# Patient Record
Sex: Female | Born: 1945 | Race: White | Hispanic: No | Smoking: Never smoker
Health system: Southern US, Community
[De-identification: ages and names within clinical notes are randomized; demographics above are authoritative.]

## PROBLEM LIST (undated history)

## (undated) DIAGNOSIS — L409 Psoriasis, unspecified: Secondary | ICD-10-CM

## (undated) DIAGNOSIS — F329 Major depressive disorder, single episode, unspecified: Secondary | ICD-10-CM

## (undated) DIAGNOSIS — M797 Fibromyalgia: Secondary | ICD-10-CM

## (undated) DIAGNOSIS — G9332 Myalgic encephalomyelitis/chronic fatigue syndrome: Secondary | ICD-10-CM

## (undated) DIAGNOSIS — R5382 Chronic fatigue, unspecified: Secondary | ICD-10-CM

## (undated) DIAGNOSIS — M8580 Other specified disorders of bone density and structure, unspecified site: Secondary | ICD-10-CM

## (undated) DIAGNOSIS — F32A Depression, unspecified: Secondary | ICD-10-CM

## (undated) HISTORY — DX: Depression, unspecified: F32.A

## (undated) HISTORY — DX: Psoriasis, unspecified: L40.9

## (undated) HISTORY — DX: Other specified disorders of bone density and structure, unspecified site: M85.80

## (undated) HISTORY — DX: Major depressive disorder, single episode, unspecified: F32.9

## (undated) HISTORY — DX: Fibromyalgia: M79.7

## (undated) HISTORY — DX: Myalgic encephalomyelitis/chronic fatigue syndrome: G93.32

## (undated) HISTORY — DX: Chronic fatigue, unspecified: R53.82

---

## 2005-12-11 ENCOUNTER — Other Ambulatory Visit: Admission: RE | Admit: 2005-12-11 | Discharge: 2005-12-11 | Payer: Self-pay | Admitting: Family Medicine

## 2006-01-18 ENCOUNTER — Ambulatory Visit (HOSPITAL_COMMUNITY): Admission: RE | Admit: 2006-01-18 | Discharge: 2006-01-18 | Payer: Self-pay | Admitting: Family Medicine

## 2006-01-28 ENCOUNTER — Encounter: Admission: RE | Admit: 2006-01-28 | Discharge: 2006-01-28 | Payer: Self-pay | Admitting: Family Medicine

## 2007-01-08 ENCOUNTER — Other Ambulatory Visit: Admission: RE | Admit: 2007-01-08 | Discharge: 2007-01-08 | Payer: Self-pay | Admitting: Family Medicine

## 2007-01-23 ENCOUNTER — Encounter: Admission: RE | Admit: 2007-01-23 | Discharge: 2007-01-23 | Payer: Self-pay | Admitting: Family Medicine

## 2008-01-02 ENCOUNTER — Encounter: Admission: RE | Admit: 2008-01-02 | Discharge: 2008-01-02 | Payer: Self-pay | Admitting: Family Medicine

## 2008-01-26 ENCOUNTER — Encounter: Admission: RE | Admit: 2008-01-26 | Discharge: 2008-01-26 | Payer: Self-pay | Admitting: Family Medicine

## 2008-03-17 ENCOUNTER — Other Ambulatory Visit: Admission: RE | Admit: 2008-03-17 | Discharge: 2008-03-17 | Payer: Self-pay | Admitting: Family Medicine

## 2008-03-29 ENCOUNTER — Emergency Department (HOSPITAL_COMMUNITY): Admission: EM | Admit: 2008-03-29 | Discharge: 2008-03-29 | Payer: Self-pay | Admitting: Emergency Medicine

## 2008-04-02 ENCOUNTER — Encounter: Admission: RE | Admit: 2008-04-02 | Discharge: 2008-04-02 | Payer: Self-pay | Admitting: Family Medicine

## 2010-05-13 ENCOUNTER — Encounter: Payer: Self-pay | Admitting: Family Medicine

## 2011-01-26 LAB — DIFFERENTIAL
Eosinophils Relative: 3 % (ref 0–5)
Lymphocytes Relative: 35 % (ref 12–46)
Lymphs Abs: 1.3 10*3/uL (ref 0.7–4.0)
Monocytes Absolute: 0.3 10*3/uL (ref 0.1–1.0)

## 2011-01-26 LAB — CBC
HCT: 40.8 % (ref 36.0–46.0)
Hemoglobin: 13.8 g/dL (ref 12.0–15.0)
RDW: 13.6 % (ref 11.5–15.5)

## 2011-01-26 LAB — POCT I-STAT, CHEM 8
BUN: 14 mg/dL (ref 6–23)
Calcium, Ion: 1.18 mmol/L (ref 1.12–1.32)
Creatinine, Ser: 1.1 mg/dL (ref 0.4–1.2)
Glucose, Bld: 111 mg/dL — ABNORMAL HIGH (ref 70–99)
TCO2: 26 mmol/L (ref 0–100)

## 2011-06-22 DIAGNOSIS — R1013 Epigastric pain: Secondary | ICD-10-CM | POA: Diagnosis not present

## 2011-06-22 DIAGNOSIS — R5381 Other malaise: Secondary | ICD-10-CM | POA: Diagnosis not present

## 2011-06-22 DIAGNOSIS — J029 Acute pharyngitis, unspecified: Secondary | ICD-10-CM | POA: Diagnosis not present

## 2011-06-22 DIAGNOSIS — R109 Unspecified abdominal pain: Secondary | ICD-10-CM | POA: Diagnosis not present

## 2011-06-22 DIAGNOSIS — R5383 Other fatigue: Secondary | ICD-10-CM | POA: Diagnosis not present

## 2011-07-17 DIAGNOSIS — IMO0001 Reserved for inherently not codable concepts without codable children: Secondary | ICD-10-CM | POA: Diagnosis not present

## 2011-07-17 DIAGNOSIS — K644 Residual hemorrhoidal skin tags: Secondary | ICD-10-CM | POA: Diagnosis not present

## 2011-07-17 DIAGNOSIS — R1013 Epigastric pain: Secondary | ICD-10-CM | POA: Diagnosis not present

## 2011-07-17 DIAGNOSIS — Z0000 Encounter for general adult medical examination without abnormal findings: Secondary | ICD-10-CM | POA: Diagnosis not present

## 2011-07-17 DIAGNOSIS — L940 Localized scleroderma [morphea]: Secondary | ICD-10-CM | POA: Diagnosis not present

## 2011-07-17 DIAGNOSIS — G4700 Insomnia, unspecified: Secondary | ICD-10-CM | POA: Diagnosis not present

## 2011-08-07 DIAGNOSIS — F0390 Unspecified dementia without behavioral disturbance: Secondary | ICD-10-CM | POA: Diagnosis not present

## 2011-08-09 ENCOUNTER — Ambulatory Visit: Payer: Self-pay | Admitting: Licensed Clinical Social Worker

## 2011-08-15 DIAGNOSIS — F329 Major depressive disorder, single episode, unspecified: Secondary | ICD-10-CM | POA: Diagnosis not present

## 2011-08-28 DIAGNOSIS — F329 Major depressive disorder, single episode, unspecified: Secondary | ICD-10-CM | POA: Diagnosis not present

## 2011-09-10 DIAGNOSIS — F329 Major depressive disorder, single episode, unspecified: Secondary | ICD-10-CM | POA: Diagnosis not present

## 2011-09-18 DIAGNOSIS — F329 Major depressive disorder, single episode, unspecified: Secondary | ICD-10-CM | POA: Diagnosis not present

## 2011-09-24 DIAGNOSIS — F329 Major depressive disorder, single episode, unspecified: Secondary | ICD-10-CM | POA: Diagnosis not present

## 2011-10-16 DIAGNOSIS — F329 Major depressive disorder, single episode, unspecified: Secondary | ICD-10-CM | POA: Diagnosis not present

## 2011-10-23 DIAGNOSIS — G4700 Insomnia, unspecified: Secondary | ICD-10-CM | POA: Diagnosis not present

## 2011-10-23 DIAGNOSIS — K649 Unspecified hemorrhoids: Secondary | ICD-10-CM | POA: Diagnosis not present

## 2011-10-23 DIAGNOSIS — L940 Localized scleroderma [morphea]: Secondary | ICD-10-CM | POA: Diagnosis not present

## 2011-10-23 DIAGNOSIS — IMO0001 Reserved for inherently not codable concepts without codable children: Secondary | ICD-10-CM | POA: Diagnosis not present

## 2011-10-30 DIAGNOSIS — F329 Major depressive disorder, single episode, unspecified: Secondary | ICD-10-CM | POA: Diagnosis not present

## 2011-11-19 DIAGNOSIS — F329 Major depressive disorder, single episode, unspecified: Secondary | ICD-10-CM | POA: Diagnosis not present

## 2011-11-26 DIAGNOSIS — F329 Major depressive disorder, single episode, unspecified: Secondary | ICD-10-CM | POA: Diagnosis not present

## 2011-12-21 DIAGNOSIS — Z79899 Other long term (current) drug therapy: Secondary | ICD-10-CM | POA: Diagnosis not present

## 2011-12-21 DIAGNOSIS — R5383 Other fatigue: Secondary | ICD-10-CM | POA: Diagnosis not present

## 2011-12-21 DIAGNOSIS — IMO0001 Reserved for inherently not codable concepts without codable children: Secondary | ICD-10-CM | POA: Diagnosis not present

## 2011-12-21 DIAGNOSIS — R404 Transient alteration of awareness: Secondary | ICD-10-CM | POA: Diagnosis not present

## 2011-12-21 DIAGNOSIS — R5381 Other malaise: Secondary | ICD-10-CM | POA: Diagnosis not present

## 2011-12-21 DIAGNOSIS — R7309 Other abnormal glucose: Secondary | ICD-10-CM | POA: Diagnosis not present

## 2012-01-01 DIAGNOSIS — F341 Dysthymic disorder: Secondary | ICD-10-CM | POA: Diagnosis not present

## 2012-01-04 DIAGNOSIS — G4710 Hypersomnia, unspecified: Secondary | ICD-10-CM | POA: Diagnosis not present

## 2012-01-04 DIAGNOSIS — R7309 Other abnormal glucose: Secondary | ICD-10-CM | POA: Diagnosis not present

## 2012-01-04 DIAGNOSIS — B009 Herpesviral infection, unspecified: Secondary | ICD-10-CM | POA: Diagnosis not present

## 2012-01-04 DIAGNOSIS — R5383 Other fatigue: Secondary | ICD-10-CM | POA: Diagnosis not present

## 2012-01-04 DIAGNOSIS — R5381 Other malaise: Secondary | ICD-10-CM | POA: Diagnosis not present

## 2012-01-09 DIAGNOSIS — R5383 Other fatigue: Secondary | ICD-10-CM | POA: Diagnosis not present

## 2012-01-09 DIAGNOSIS — R7309 Other abnormal glucose: Secondary | ICD-10-CM | POA: Diagnosis not present

## 2012-01-09 DIAGNOSIS — R5381 Other malaise: Secondary | ICD-10-CM | POA: Diagnosis not present

## 2012-01-13 DIAGNOSIS — G4710 Hypersomnia, unspecified: Secondary | ICD-10-CM | POA: Diagnosis not present

## 2012-02-05 DIAGNOSIS — IMO0001 Reserved for inherently not codable concepts without codable children: Secondary | ICD-10-CM | POA: Diagnosis not present

## 2012-02-05 DIAGNOSIS — B009 Herpesviral infection, unspecified: Secondary | ICD-10-CM | POA: Diagnosis not present

## 2012-02-05 DIAGNOSIS — R5381 Other malaise: Secondary | ICD-10-CM | POA: Diagnosis not present

## 2012-02-05 DIAGNOSIS — G4700 Insomnia, unspecified: Secondary | ICD-10-CM | POA: Diagnosis not present

## 2012-02-05 DIAGNOSIS — F329 Major depressive disorder, single episode, unspecified: Secondary | ICD-10-CM | POA: Diagnosis not present

## 2012-02-20 DIAGNOSIS — M549 Dorsalgia, unspecified: Secondary | ICD-10-CM | POA: Diagnosis not present

## 2012-02-20 DIAGNOSIS — M62838 Other muscle spasm: Secondary | ICD-10-CM | POA: Diagnosis not present

## 2012-02-21 ENCOUNTER — Other Ambulatory Visit: Payer: Self-pay

## 2012-02-21 ENCOUNTER — Ambulatory Visit
Admission: RE | Admit: 2012-02-21 | Discharge: 2012-02-21 | Disposition: A | Discharge status: Home / Self Care | Payer: Medicare Other | Source: Ambulatory Visit

## 2012-02-21 DIAGNOSIS — M549 Dorsalgia, unspecified: Secondary | ICD-10-CM

## 2012-02-21 DIAGNOSIS — M47814 Spondylosis without myelopathy or radiculopathy, thoracic region: Secondary | ICD-10-CM | POA: Diagnosis not present

## 2012-02-21 DIAGNOSIS — M5137 Other intervertebral disc degeneration, lumbosacral region: Secondary | ICD-10-CM | POA: Diagnosis not present

## 2012-02-29 DIAGNOSIS — F329 Major depressive disorder, single episode, unspecified: Secondary | ICD-10-CM | POA: Diagnosis not present

## 2012-03-03 DIAGNOSIS — B009 Herpesviral infection, unspecified: Secondary | ICD-10-CM | POA: Diagnosis not present

## 2012-03-03 DIAGNOSIS — D239 Other benign neoplasm of skin, unspecified: Secondary | ICD-10-CM | POA: Diagnosis not present

## 2012-03-03 DIAGNOSIS — L940 Localized scleroderma [morphea]: Secondary | ICD-10-CM | POA: Diagnosis not present

## 2012-03-03 DIAGNOSIS — L408 Other psoriasis: Secondary | ICD-10-CM | POA: Diagnosis not present

## 2012-03-07 DIAGNOSIS — IMO0001 Reserved for inherently not codable concepts without codable children: Secondary | ICD-10-CM | POA: Diagnosis not present

## 2012-03-07 DIAGNOSIS — M546 Pain in thoracic spine: Secondary | ICD-10-CM | POA: Diagnosis not present

## 2012-03-07 DIAGNOSIS — M545 Low back pain: Secondary | ICD-10-CM | POA: Diagnosis not present

## 2012-03-09 ENCOUNTER — Emergency Department (HOSPITAL_COMMUNITY)
Admission: EM | Admit: 2012-03-09 | Discharge: 2012-03-10 | Disposition: A | Discharge status: Home / Self Care | Payer: Medicare Other | Attending: Emergency Medicine | Admitting: Emergency Medicine

## 2012-03-09 DIAGNOSIS — Z79899 Other long term (current) drug therapy: Secondary | ICD-10-CM | POA: Insufficient documentation

## 2012-03-09 DIAGNOSIS — M545 Low back pain, unspecified: Secondary | ICD-10-CM | POA: Insufficient documentation

## 2012-03-09 DIAGNOSIS — M79609 Pain in unspecified limb: Secondary | ICD-10-CM | POA: Diagnosis not present

## 2012-03-09 NOTE — ED Notes (Signed)
ems- lower back pain radiated to bil side but origin of pain is spine per patient ,  for 3 months, went to spine specialist seem, schedule MRI, has not had yet, pain increase ly got worse today, hurt bad 10/10. Took flexeril, tramadol, nothing is helping.Marland Kitchen Hx depression psoriais, fibromyalgia- vs- 128/ hr-88,, given topical cream use for skin, ativan daily,

## 2012-03-09 NOTE — ED Notes (Signed)
Bed:[facility]ALA<BR> Expected date:03/09/12<BR> Expected time:11:07 PM<BR> Means of arrival:Ambulance<BR> Comments:<BR> Ambulatory; back pain

## 2012-03-10 DIAGNOSIS — M549 Dorsalgia, unspecified: Secondary | ICD-10-CM | POA: Diagnosis not present

## 2012-03-10 DIAGNOSIS — G4700 Insomnia, unspecified: Secondary | ICD-10-CM | POA: Diagnosis not present

## 2012-03-10 DIAGNOSIS — G4710 Hypersomnia, unspecified: Secondary | ICD-10-CM | POA: Diagnosis not present

## 2012-03-10 MED ORDER — DIAZEPAM 2 MG PO TABS
2.0000 mg | ORAL_TABLET | Freq: Once | ORAL | Status: AC
  Administered 2012-03-10: 2 mg via ORAL
  Filled 2012-03-10: qty 1

## 2012-03-10 MED ORDER — DIAZEPAM 2 MG PO TABS: 2.0000 mg | ORAL_TABLET | Freq: Four times a day (QID) | ORAL | Status: DC | PRN

## 2012-03-10 MED ORDER — HYDROCODONE-ACETAMINOPHEN 7.5-500 MG/15ML PO SOLN
5.0000 mL | Freq: Once | ORAL | Status: AC
  Administered 2012-03-10: 5 mL via ORAL
  Filled 2012-03-10 (×2): qty 15

## 2012-03-10 MED ORDER — HYDROCODONE-ACETAMINOPHEN 7.5-500 MG/15ML PO SOLN: 5.0000 mL | ORAL | Status: DC | PRN

## 2012-03-10 MED ORDER — DIAZEPAM 2 MG PO TABS
ORAL_TABLET | ORAL | Status: AC
  Administered 2012-03-10: 2 mg
  Filled 2012-03-10: qty 1

## 2012-03-10 MED ORDER — KETOROLAC TROMETHAMINE 30 MG/ML IJ SOLN
30.0000 mg | Freq: Once | INTRAMUSCULAR | Status: AC
  Administered 2012-03-10: 30 mg via INTRAMUSCULAR
  Filled 2012-03-10: qty 1

## 2012-03-10 MED ORDER — DIAZEPAM 2 MG PO TABS: 2.0000 mg | ORAL_TABLET | Freq: Once | ORAL | Status: DC

## 2012-03-10 NOTE — Progress Notes (Signed)
Chaplain engaged this patient who was actually bedded in the hall.  She was having back spasms and was in pain.  She states a sensitivity to medications.  She had been given a minimal dose of Valium and she felt it was useless and was hesitant about the tylenol-hydrocodone liquid the nurse practitioner wanted to give her.  In talking with the nurse practitioner, patient felt the NP was very condescending.  The Chaplain helped her work through that feeling, which wasn't easy, because the NP seemed to him to be somewhat insensitive.  The conversation moved to a question about suicide, which evolved to a more real discussion about her frustration with her chronic pain, the loss of hope for any relief with indications of guilt over things she has said in frustrations and certain "mistakes" she has made as a mother.  The Chaplain suggested she have a real heart-to-heart with her primary care physician regarding effective medication interventions.  For many things the Chaplain said, the patient had a been-there-done-that type of response, but she felt the Chaplain did understand her and was supportive.  The patient is battling chronic back pain and really had hoped (again) that this trip to the ED might open some doors for relief.  She has congregational support, but not necessarily to the extent of understanding what some goes through who lives in constant pain, though her pastor apparently suffers with chronic pain issues.  She has three children.  If she should be in the hospital, a Chaplain visit may be helpful, but the issue is one of long-term frustration leading to a sense of hopelessness.  The patient is not having suicidal ideation and speaks of dying in the context of the frustration-pain combination.  She clearly sttes she would not l;eave the burden of her suicide on her children, and it was really voicing deep frustration and near total hopelessness and helplessness.  Rema Jasmine, Chaplain Pager:  [phone]

## 2012-03-10 NOTE — ED Notes (Signed)
Ambulate pt half way to the bathroom, pt sts to writer her muscle spasms started back and she is still in pain.  Pt was able to finish walking to the bathroom.

## 2012-03-10 NOTE — ED Provider Notes (Signed)
History     CSN: [id number]  Arrival date & time 03/09/12  2329   First MD Initiated Contact with Patient 03/09/12 2339      Chief Complaint  Patient presents with  . Back Pain    radiating bil sides    (Consider location/radiation/quality/duration/timing/severity/associated sxs/prior treatment) HPI Comments: Patient has had chronic low back pain for the past 3-4 months.  She has been seen by her primary care physician, who sent her to the spine.  Specialist, who is scheduling an MRI of her lower spine.  Sometime this week.  She has been taking Flexeril, and Ultram, without any relief.  She has a history of fibromyalgia as well.  She denies any injury to that caused her pain.  She states today.  The pain is so bad, that she's been in the bed.  All day coming to the emergency room.  This evening by ambulance Patient had thoracic and lumbar x-rays performed October 31 which were normal  Patient is a 67 y.o. female presenting with back pain. The history is provided by the patient.  Back Pain  This is a chronic problem. The problem occurs constantly. The pain is present in the lumbar spine. The quality of the pain is described as aching. The pain does not radiate. The pain is at a severity of 8/10. The pain is moderate. Pertinent negatives include no chest pain, no fever, no numbness, no abdominal pain, no dysuria and no weakness.    No past medical history on file.  No past surgical history on file.  No family history on file.  History  Substance Use Topics  . Smoking status: Not on file  . Smokeless tobacco: Not on file  . Alcohol Use: Not on file    OB History    No data available      Review of Systems  Constitutional: Negative for fever and chills.  Respiratory: Negative for shortness of breath.   Cardiovascular: Negative for chest pain.  Gastrointestinal: Negative for nausea, vomiting, abdominal pain, diarrhea and constipation.  Genitourinary: Negative for dysuria.    Musculoskeletal: Positive for back pain. Negative for myalgias.  Skin: Negative for rash and wound.  Neurological: Negative for dizziness, weakness and numbness.    Allergies  Nsaids  Home Medications   Current Outpatient Rx  Name  Route  Sig  Dispense  Refill  . ACETAMINOPHEN 500 MG PO TABS   Oral   Take 500 mg by mouth every 6 (six) hours as needed. For pain         . CYCLOBENZAPRINE HCL 5 MG PO TABS   Oral   Take 10 mg by mouth 2 (two) times daily as needed. For pain         . LORAZEPAM 1 MG PO TABS   Oral   Take 0.5-1 mg by mouth daily as needed. For anxiety         . TRAMADOL HCL 50 MG PO TABS   Oral   Take 50 mg by mouth every 6 (six) hours as needed. For pain         . DIAZEPAM 2 MG PO TABS   Oral   Take 1 tablet (2 mg total) by mouth every 6 (six) hours as needed (muscle spasm).   10 tablet   0   . HYDROCODONE-ACETAMINOPHEN 7.5-500 MG/15ML PO SOLN   Oral   Take 5 mLs by mouth every 4 (four) hours as needed for pain.   60 mL  0     BP 115/64  Pulse 62  Temp 97.9 F (36.6 C) (Oral)  Resp 20  SpO2 97%  Physical Exam  Constitutional: She is oriented to person, place, and time. She appears well-developed.  HENT:  Head: Normocephalic.  Eyes: Pupils are equal, round, and reactive to light.  Neck: Normal range of motion.  Cardiovascular: Normal rate.   Pulmonary/Chest: Effort normal.  Musculoskeletal: Normal range of motion. She exhibits no edema and no tenderness.  Neurological: She is alert and oriented to person, place, and time.  Skin: Skin is warm. No rash noted. No erythema.    ED Course  Procedures (including critical care time)  Labs Reviewed - No data to display No results found.   1. Low back pain radiating to both legs       MDM  Will treat with IM, Toradol, and by mouth Valium, small doses, as patient, states she's very sensitive to medications and has taken everything in         Arman Filter, NP 03/10/12  [phone]

## 2012-03-10 NOTE — ED Provider Notes (Signed)
Medical screening examination/treatment/procedure(s) were performed by non-physician practitioner and as supervising physician I was immediately available for consultation/collaboration.   Ayisha Pol H Aricela Bertagnolli, MD 03/10/12 0554 

## 2012-03-10 NOTE — ED Notes (Signed)
MD at bedside. 

## 2012-03-10 NOTE — ED Notes (Signed)
Patient ambulated per order. Patient recd, asst by one person to ambulate from stretcher to nursing station, ambulated by self to bathroom , then escort minimally by tech to stretcher, patient moves slowly per observation appears to have discomfort on ambulate  but was able to move safely with minimal difficulty.

## 2012-03-11 DIAGNOSIS — M5137 Other intervertebral disc degeneration, lumbosacral region: Secondary | ICD-10-CM | POA: Diagnosis not present

## 2012-03-11 DIAGNOSIS — M5126 Other intervertebral disc displacement, lumbar region: Secondary | ICD-10-CM | POA: Diagnosis not present

## 2012-03-12 DIAGNOSIS — IMO0001 Reserved for inherently not codable concepts without codable children: Secondary | ICD-10-CM | POA: Diagnosis not present

## 2012-03-12 DIAGNOSIS — R5381 Other malaise: Secondary | ICD-10-CM | POA: Diagnosis not present

## 2012-03-12 DIAGNOSIS — F329 Major depressive disorder, single episode, unspecified: Secondary | ICD-10-CM | POA: Diagnosis not present

## 2012-03-12 DIAGNOSIS — M5380 Other specified dorsopathies, site unspecified: Secondary | ICD-10-CM | POA: Diagnosis not present

## 2012-03-14 DIAGNOSIS — M546 Pain in thoracic spine: Secondary | ICD-10-CM | POA: Diagnosis not present

## 2012-03-14 DIAGNOSIS — M545 Low back pain: Secondary | ICD-10-CM | POA: Diagnosis not present

## 2012-03-14 DIAGNOSIS — IMO0001 Reserved for inherently not codable concepts without codable children: Secondary | ICD-10-CM | POA: Diagnosis not present

## 2012-03-17 DIAGNOSIS — F331 Major depressive disorder, recurrent, moderate: Secondary | ICD-10-CM | POA: Diagnosis not present

## 2012-03-18 DIAGNOSIS — F331 Major depressive disorder, recurrent, moderate: Secondary | ICD-10-CM | POA: Diagnosis not present

## 2012-03-19 DIAGNOSIS — F39 Unspecified mood [affective] disorder: Secondary | ICD-10-CM | POA: Diagnosis not present

## 2012-03-19 DIAGNOSIS — F4542 Pain disorder with related psychological factors: Secondary | ICD-10-CM | POA: Diagnosis not present

## 2012-03-25 DIAGNOSIS — IMO0001 Reserved for inherently not codable concepts without codable children: Secondary | ICD-10-CM | POA: Diagnosis not present

## 2012-03-25 DIAGNOSIS — R7309 Other abnormal glucose: Secondary | ICD-10-CM | POA: Diagnosis not present

## 2012-03-25 DIAGNOSIS — R030 Elevated blood-pressure reading, without diagnosis of hypertension: Secondary | ICD-10-CM | POA: Diagnosis not present

## 2012-03-25 DIAGNOSIS — M5380 Other specified dorsopathies, site unspecified: Secondary | ICD-10-CM | POA: Diagnosis not present

## 2012-03-26 DIAGNOSIS — F331 Major depressive disorder, recurrent, moderate: Secondary | ICD-10-CM | POA: Diagnosis not present

## 2012-04-04 DIAGNOSIS — F331 Major depressive disorder, recurrent, moderate: Secondary | ICD-10-CM | POA: Diagnosis not present

## 2012-04-11 DIAGNOSIS — F331 Major depressive disorder, recurrent, moderate: Secondary | ICD-10-CM | POA: Diagnosis not present

## 2012-04-18 DIAGNOSIS — F331 Major depressive disorder, recurrent, moderate: Secondary | ICD-10-CM | POA: Diagnosis not present

## 2012-04-21 DIAGNOSIS — F331 Major depressive disorder, recurrent, moderate: Secondary | ICD-10-CM | POA: Diagnosis not present

## 2012-04-24 DIAGNOSIS — F329 Major depressive disorder, single episode, unspecified: Secondary | ICD-10-CM | POA: Diagnosis not present

## 2012-04-24 DIAGNOSIS — F4542 Pain disorder with related psychological factors: Secondary | ICD-10-CM | POA: Diagnosis not present

## 2012-04-25 DIAGNOSIS — F331 Major depressive disorder, recurrent, moderate: Secondary | ICD-10-CM | POA: Diagnosis not present

## 2012-04-30 DIAGNOSIS — R7309 Other abnormal glucose: Secondary | ICD-10-CM | POA: Diagnosis not present

## 2012-04-30 DIAGNOSIS — M5380 Other specified dorsopathies, site unspecified: Secondary | ICD-10-CM | POA: Diagnosis not present

## 2012-04-30 DIAGNOSIS — IMO0001 Reserved for inherently not codable concepts without codable children: Secondary | ICD-10-CM | POA: Diagnosis not present

## 2012-05-02 DIAGNOSIS — F329 Major depressive disorder, single episode, unspecified: Secondary | ICD-10-CM | POA: Diagnosis not present

## 2012-05-02 DIAGNOSIS — F4542 Pain disorder with related psychological factors: Secondary | ICD-10-CM | POA: Diagnosis not present

## 2012-05-05 DIAGNOSIS — F331 Major depressive disorder, recurrent, moderate: Secondary | ICD-10-CM | POA: Diagnosis not present

## 2012-05-07 DIAGNOSIS — F331 Major depressive disorder, recurrent, moderate: Secondary | ICD-10-CM | POA: Diagnosis not present

## 2012-05-13 DIAGNOSIS — F331 Major depressive disorder, recurrent, moderate: Secondary | ICD-10-CM | POA: Diagnosis not present

## 2012-05-23 DIAGNOSIS — F331 Major depressive disorder, recurrent, moderate: Secondary | ICD-10-CM | POA: Diagnosis not present

## 2012-06-02 DIAGNOSIS — F331 Major depressive disorder, recurrent, moderate: Secondary | ICD-10-CM | POA: Diagnosis not present

## 2012-06-17 DIAGNOSIS — F331 Major depressive disorder, recurrent, moderate: Secondary | ICD-10-CM | POA: Diagnosis not present

## 2012-07-12 DIAGNOSIS — F339 Major depressive disorder, recurrent, unspecified: Secondary | ICD-10-CM | POA: Diagnosis not present

## 2012-07-15 DIAGNOSIS — F339 Major depressive disorder, recurrent, unspecified: Secondary | ICD-10-CM | POA: Diagnosis not present

## 2012-07-18 DIAGNOSIS — F339 Major depressive disorder, recurrent, unspecified: Secondary | ICD-10-CM | POA: Diagnosis not present

## 2012-07-22 DIAGNOSIS — F339 Major depressive disorder, recurrent, unspecified: Secondary | ICD-10-CM | POA: Diagnosis not present

## 2012-07-25 DIAGNOSIS — F339 Major depressive disorder, recurrent, unspecified: Secondary | ICD-10-CM | POA: Diagnosis not present

## 2012-07-29 DIAGNOSIS — F339 Major depressive disorder, recurrent, unspecified: Secondary | ICD-10-CM | POA: Diagnosis not present

## 2012-08-12 DIAGNOSIS — F339 Major depressive disorder, recurrent, unspecified: Secondary | ICD-10-CM | POA: Diagnosis not present

## 2012-08-15 DIAGNOSIS — F339 Major depressive disorder, recurrent, unspecified: Secondary | ICD-10-CM | POA: Diagnosis not present

## 2012-08-19 DIAGNOSIS — F339 Major depressive disorder, recurrent, unspecified: Secondary | ICD-10-CM | POA: Diagnosis not present

## 2012-09-02 DIAGNOSIS — F339 Major depressive disorder, recurrent, unspecified: Secondary | ICD-10-CM | POA: Diagnosis not present

## 2012-09-05 DIAGNOSIS — F329 Major depressive disorder, single episode, unspecified: Secondary | ICD-10-CM | POA: Diagnosis not present

## 2012-09-05 DIAGNOSIS — IMO0001 Reserved for inherently not codable concepts without codable children: Secondary | ICD-10-CM | POA: Diagnosis not present

## 2012-09-05 DIAGNOSIS — G4700 Insomnia, unspecified: Secondary | ICD-10-CM | POA: Diagnosis not present

## 2012-09-09 DIAGNOSIS — F339 Major depressive disorder, recurrent, unspecified: Secondary | ICD-10-CM | POA: Diagnosis not present

## 2012-09-12 DIAGNOSIS — T148 Other injury of unspecified body region: Secondary | ICD-10-CM | POA: Diagnosis not present

## 2012-09-12 DIAGNOSIS — W57XXXA Bitten or stung by nonvenomous insect and other nonvenomous arthropods, initial encounter: Secondary | ICD-10-CM | POA: Diagnosis not present

## 2012-09-16 DIAGNOSIS — F339 Major depressive disorder, recurrent, unspecified: Secondary | ICD-10-CM | POA: Diagnosis not present

## 2012-09-23 DIAGNOSIS — F339 Major depressive disorder, recurrent, unspecified: Secondary | ICD-10-CM | POA: Diagnosis not present

## 2012-09-30 DIAGNOSIS — F339 Major depressive disorder, recurrent, unspecified: Secondary | ICD-10-CM | POA: Diagnosis not present

## 2012-10-07 DIAGNOSIS — F339 Major depressive disorder, recurrent, unspecified: Secondary | ICD-10-CM | POA: Diagnosis not present

## 2012-10-14 DIAGNOSIS — F339 Major depressive disorder, recurrent, unspecified: Secondary | ICD-10-CM | POA: Diagnosis not present

## 2012-11-04 DIAGNOSIS — F339 Major depressive disorder, recurrent, unspecified: Secondary | ICD-10-CM | POA: Diagnosis not present

## 2012-11-11 DIAGNOSIS — F339 Major depressive disorder, recurrent, unspecified: Secondary | ICD-10-CM | POA: Diagnosis not present

## 2012-11-18 DIAGNOSIS — F339 Major depressive disorder, recurrent, unspecified: Secondary | ICD-10-CM | POA: Diagnosis not present

## 2012-11-25 DIAGNOSIS — F339 Major depressive disorder, recurrent, unspecified: Secondary | ICD-10-CM | POA: Diagnosis not present

## 2012-12-02 DIAGNOSIS — F339 Major depressive disorder, recurrent, unspecified: Secondary | ICD-10-CM | POA: Diagnosis not present

## 2012-12-09 DIAGNOSIS — F339 Major depressive disorder, recurrent, unspecified: Secondary | ICD-10-CM | POA: Diagnosis not present

## 2012-12-16 DIAGNOSIS — F339 Major depressive disorder, recurrent, unspecified: Secondary | ICD-10-CM | POA: Diagnosis not present

## 2012-12-22 DIAGNOSIS — F339 Major depressive disorder, recurrent, unspecified: Secondary | ICD-10-CM | POA: Diagnosis not present

## 2012-12-23 DIAGNOSIS — M5380 Other specified dorsopathies, site unspecified: Secondary | ICD-10-CM | POA: Diagnosis not present

## 2012-12-23 DIAGNOSIS — E559 Vitamin D deficiency, unspecified: Secondary | ICD-10-CM | POA: Diagnosis not present

## 2012-12-23 DIAGNOSIS — F329 Major depressive disorder, single episode, unspecified: Secondary | ICD-10-CM | POA: Diagnosis not present

## 2012-12-23 DIAGNOSIS — IMO0001 Reserved for inherently not codable concepts without codable children: Secondary | ICD-10-CM | POA: Diagnosis not present

## 2012-12-23 DIAGNOSIS — R5382 Chronic fatigue, unspecified: Secondary | ICD-10-CM | POA: Diagnosis not present

## 2012-12-30 DIAGNOSIS — F339 Major depressive disorder, recurrent, unspecified: Secondary | ICD-10-CM | POA: Diagnosis not present

## 2013-01-01 DIAGNOSIS — F339 Major depressive disorder, recurrent, unspecified: Secondary | ICD-10-CM | POA: Diagnosis not present

## 2013-01-05 DIAGNOSIS — F339 Major depressive disorder, recurrent, unspecified: Secondary | ICD-10-CM | POA: Diagnosis not present

## 2013-01-08 DIAGNOSIS — F339 Major depressive disorder, recurrent, unspecified: Secondary | ICD-10-CM | POA: Diagnosis not present

## 2013-01-13 DIAGNOSIS — F339 Major depressive disorder, recurrent, unspecified: Secondary | ICD-10-CM | POA: Diagnosis not present

## 2013-01-14 DIAGNOSIS — F329 Major depressive disorder, single episode, unspecified: Secondary | ICD-10-CM | POA: Diagnosis not present

## 2013-01-14 DIAGNOSIS — R5381 Other malaise: Secondary | ICD-10-CM | POA: Diagnosis not present

## 2013-01-14 DIAGNOSIS — IMO0001 Reserved for inherently not codable concepts without codable children: Secondary | ICD-10-CM | POA: Diagnosis not present

## 2013-01-14 DIAGNOSIS — L408 Other psoriasis: Secondary | ICD-10-CM | POA: Diagnosis not present

## 2013-01-15 DIAGNOSIS — IMO0001 Reserved for inherently not codable concepts without codable children: Secondary | ICD-10-CM | POA: Diagnosis not present

## 2013-01-15 DIAGNOSIS — F329 Major depressive disorder, single episode, unspecified: Secondary | ICD-10-CM | POA: Diagnosis not present

## 2013-01-15 DIAGNOSIS — F339 Major depressive disorder, recurrent, unspecified: Secondary | ICD-10-CM | POA: Diagnosis not present

## 2013-01-15 DIAGNOSIS — R5381 Other malaise: Secondary | ICD-10-CM | POA: Diagnosis not present

## 2013-01-22 DIAGNOSIS — F339 Major depressive disorder, recurrent, unspecified: Secondary | ICD-10-CM | POA: Diagnosis not present

## 2013-02-03 DIAGNOSIS — F339 Major depressive disorder, recurrent, unspecified: Secondary | ICD-10-CM | POA: Diagnosis not present

## 2013-02-09 DIAGNOSIS — E559 Vitamin D deficiency, unspecified: Secondary | ICD-10-CM | POA: Diagnosis not present

## 2013-02-09 DIAGNOSIS — IMO0001 Reserved for inherently not codable concepts without codable children: Secondary | ICD-10-CM | POA: Diagnosis not present

## 2013-02-09 DIAGNOSIS — R5381 Other malaise: Secondary | ICD-10-CM | POA: Diagnosis not present

## 2013-02-09 DIAGNOSIS — G8929 Other chronic pain: Secondary | ICD-10-CM | POA: Diagnosis not present

## 2013-02-11 DIAGNOSIS — F339 Major depressive disorder, recurrent, unspecified: Secondary | ICD-10-CM | POA: Diagnosis not present

## 2013-02-17 DIAGNOSIS — F339 Major depressive disorder, recurrent, unspecified: Secondary | ICD-10-CM | POA: Diagnosis not present

## 2013-02-24 DIAGNOSIS — F339 Major depressive disorder, recurrent, unspecified: Secondary | ICD-10-CM | POA: Diagnosis not present

## 2013-03-03 DIAGNOSIS — F339 Major depressive disorder, recurrent, unspecified: Secondary | ICD-10-CM | POA: Diagnosis not present

## 2013-03-10 DIAGNOSIS — F339 Major depressive disorder, recurrent, unspecified: Secondary | ICD-10-CM | POA: Diagnosis not present

## 2013-03-11 DIAGNOSIS — R5381 Other malaise: Secondary | ICD-10-CM | POA: Diagnosis not present

## 2013-03-11 DIAGNOSIS — IMO0001 Reserved for inherently not codable concepts without codable children: Secondary | ICD-10-CM | POA: Diagnosis not present

## 2013-03-17 DIAGNOSIS — F339 Major depressive disorder, recurrent, unspecified: Secondary | ICD-10-CM | POA: Diagnosis not present

## 2013-03-24 DIAGNOSIS — F339 Major depressive disorder, recurrent, unspecified: Secondary | ICD-10-CM | POA: Diagnosis not present

## 2013-03-31 DIAGNOSIS — F339 Major depressive disorder, recurrent, unspecified: Secondary | ICD-10-CM | POA: Diagnosis not present

## 2013-04-01 DIAGNOSIS — D239 Other benign neoplasm of skin, unspecified: Secondary | ICD-10-CM | POA: Diagnosis not present

## 2013-04-01 DIAGNOSIS — L408 Other psoriasis: Secondary | ICD-10-CM | POA: Diagnosis not present

## 2013-04-01 DIAGNOSIS — L940 Localized scleroderma [morphea]: Secondary | ICD-10-CM | POA: Diagnosis not present

## 2013-04-07 DIAGNOSIS — F339 Major depressive disorder, recurrent, unspecified: Secondary | ICD-10-CM | POA: Diagnosis not present

## 2013-04-14 DIAGNOSIS — F339 Major depressive disorder, recurrent, unspecified: Secondary | ICD-10-CM | POA: Diagnosis not present

## 2013-04-28 DIAGNOSIS — F339 Major depressive disorder, recurrent, unspecified: Secondary | ICD-10-CM | POA: Diagnosis not present

## 2013-05-01 DIAGNOSIS — G9332 Myalgic encephalomyelitis/chronic fatigue syndrome: Secondary | ICD-10-CM | POA: Diagnosis not present

## 2013-05-01 DIAGNOSIS — R5382 Chronic fatigue, unspecified: Secondary | ICD-10-CM | POA: Diagnosis not present

## 2013-05-05 DIAGNOSIS — F339 Major depressive disorder, recurrent, unspecified: Secondary | ICD-10-CM | POA: Diagnosis not present

## 2013-05-08 DIAGNOSIS — R5381 Other malaise: Secondary | ICD-10-CM | POA: Diagnosis not present

## 2013-05-08 DIAGNOSIS — IMO0001 Reserved for inherently not codable concepts without codable children: Secondary | ICD-10-CM | POA: Diagnosis not present

## 2013-05-08 DIAGNOSIS — R5383 Other fatigue: Secondary | ICD-10-CM | POA: Diagnosis not present

## 2013-05-12 DIAGNOSIS — F339 Major depressive disorder, recurrent, unspecified: Secondary | ICD-10-CM | POA: Diagnosis not present

## 2013-05-19 DIAGNOSIS — F339 Major depressive disorder, recurrent, unspecified: Secondary | ICD-10-CM | POA: Diagnosis not present

## 2013-05-26 DIAGNOSIS — F339 Major depressive disorder, recurrent, unspecified: Secondary | ICD-10-CM | POA: Diagnosis not present

## 2013-06-01 DIAGNOSIS — F339 Major depressive disorder, recurrent, unspecified: Secondary | ICD-10-CM | POA: Diagnosis not present

## 2013-06-03 DIAGNOSIS — F339 Major depressive disorder, recurrent, unspecified: Secondary | ICD-10-CM | POA: Diagnosis not present

## 2013-06-15 DIAGNOSIS — F339 Major depressive disorder, recurrent, unspecified: Secondary | ICD-10-CM | POA: Diagnosis not present

## 2013-06-22 DIAGNOSIS — F339 Major depressive disorder, recurrent, unspecified: Secondary | ICD-10-CM | POA: Diagnosis not present

## 2013-07-06 DIAGNOSIS — F339 Major depressive disorder, recurrent, unspecified: Secondary | ICD-10-CM | POA: Diagnosis not present

## 2013-07-14 DIAGNOSIS — F339 Major depressive disorder, recurrent, unspecified: Secondary | ICD-10-CM | POA: Diagnosis not present

## 2013-07-21 DIAGNOSIS — F339 Major depressive disorder, recurrent, unspecified: Secondary | ICD-10-CM | POA: Diagnosis not present

## 2013-07-28 DIAGNOSIS — F339 Major depressive disorder, recurrent, unspecified: Secondary | ICD-10-CM | POA: Diagnosis not present

## 2013-08-03 DIAGNOSIS — R5381 Other malaise: Secondary | ICD-10-CM | POA: Diagnosis not present

## 2013-08-03 DIAGNOSIS — Z0000 Encounter for general adult medical examination without abnormal findings: Secondary | ICD-10-CM | POA: Diagnosis not present

## 2013-08-03 DIAGNOSIS — E785 Hyperlipidemia, unspecified: Secondary | ICD-10-CM | POA: Diagnosis not present

## 2013-08-03 DIAGNOSIS — IMO0001 Reserved for inherently not codable concepts without codable children: Secondary | ICD-10-CM | POA: Diagnosis not present

## 2013-08-04 DIAGNOSIS — F339 Major depressive disorder, recurrent, unspecified: Secondary | ICD-10-CM | POA: Diagnosis not present

## 2013-08-12 DIAGNOSIS — F339 Major depressive disorder, recurrent, unspecified: Secondary | ICD-10-CM | POA: Diagnosis not present

## 2013-08-18 DIAGNOSIS — F339 Major depressive disorder, recurrent, unspecified: Secondary | ICD-10-CM | POA: Diagnosis not present

## 2013-08-25 DIAGNOSIS — F339 Major depressive disorder, recurrent, unspecified: Secondary | ICD-10-CM | POA: Diagnosis not present

## 2013-09-01 DIAGNOSIS — F339 Major depressive disorder, recurrent, unspecified: Secondary | ICD-10-CM | POA: Diagnosis not present

## 2013-09-08 DIAGNOSIS — F339 Major depressive disorder, recurrent, unspecified: Secondary | ICD-10-CM | POA: Diagnosis not present

## 2013-09-15 DIAGNOSIS — F339 Major depressive disorder, recurrent, unspecified: Secondary | ICD-10-CM | POA: Diagnosis not present

## 2013-09-22 DIAGNOSIS — F339 Major depressive disorder, recurrent, unspecified: Secondary | ICD-10-CM | POA: Diagnosis not present

## 2013-09-23 DIAGNOSIS — L03317 Cellulitis of buttock: Secondary | ICD-10-CM | POA: Diagnosis not present

## 2013-09-23 DIAGNOSIS — L0231 Cutaneous abscess of buttock: Secondary | ICD-10-CM | POA: Diagnosis not present

## 2013-09-23 DIAGNOSIS — T148 Other injury of unspecified body region: Secondary | ICD-10-CM | POA: Diagnosis not present

## 2013-09-23 DIAGNOSIS — B009 Herpesviral infection, unspecified: Secondary | ICD-10-CM | POA: Diagnosis not present

## 2013-09-29 DIAGNOSIS — F339 Major depressive disorder, recurrent, unspecified: Secondary | ICD-10-CM | POA: Diagnosis not present

## 2013-10-06 DIAGNOSIS — F339 Major depressive disorder, recurrent, unspecified: Secondary | ICD-10-CM | POA: Diagnosis not present

## 2013-10-13 DIAGNOSIS — F339 Major depressive disorder, recurrent, unspecified: Secondary | ICD-10-CM | POA: Diagnosis not present

## 2013-11-03 DIAGNOSIS — F339 Major depressive disorder, recurrent, unspecified: Secondary | ICD-10-CM | POA: Diagnosis not present

## 2013-11-10 DIAGNOSIS — F339 Major depressive disorder, recurrent, unspecified: Secondary | ICD-10-CM | POA: Diagnosis not present

## 2013-11-17 DIAGNOSIS — F339 Major depressive disorder, recurrent, unspecified: Secondary | ICD-10-CM | POA: Diagnosis not present

## 2013-11-18 DIAGNOSIS — G4700 Insomnia, unspecified: Secondary | ICD-10-CM | POA: Diagnosis not present

## 2013-11-18 DIAGNOSIS — R5383 Other fatigue: Secondary | ICD-10-CM | POA: Diagnosis not present

## 2013-11-18 DIAGNOSIS — F329 Major depressive disorder, single episode, unspecified: Secondary | ICD-10-CM | POA: Diagnosis not present

## 2013-11-18 DIAGNOSIS — E559 Vitamin D deficiency, unspecified: Secondary | ICD-10-CM | POA: Diagnosis not present

## 2013-11-18 DIAGNOSIS — F3289 Other specified depressive episodes: Secondary | ICD-10-CM | POA: Diagnosis not present

## 2013-11-18 DIAGNOSIS — Z1211 Encounter for screening for malignant neoplasm of colon: Secondary | ICD-10-CM | POA: Diagnosis not present

## 2013-11-18 DIAGNOSIS — R5381 Other malaise: Secondary | ICD-10-CM | POA: Diagnosis not present

## 2013-11-18 DIAGNOSIS — IMO0001 Reserved for inherently not codable concepts without codable children: Secondary | ICD-10-CM | POA: Diagnosis not present

## 2013-11-24 DIAGNOSIS — F339 Major depressive disorder, recurrent, unspecified: Secondary | ICD-10-CM | POA: Diagnosis not present

## 2013-12-01 DIAGNOSIS — F339 Major depressive disorder, recurrent, unspecified: Secondary | ICD-10-CM | POA: Diagnosis not present

## 2013-12-08 DIAGNOSIS — F339 Major depressive disorder, recurrent, unspecified: Secondary | ICD-10-CM | POA: Diagnosis not present

## 2013-12-15 DIAGNOSIS — F339 Major depressive disorder, recurrent, unspecified: Secondary | ICD-10-CM | POA: Diagnosis not present

## 2013-12-16 DIAGNOSIS — Z1211 Encounter for screening for malignant neoplasm of colon: Secondary | ICD-10-CM | POA: Diagnosis not present

## 2013-12-22 DIAGNOSIS — L408 Other psoriasis: Secondary | ICD-10-CM | POA: Diagnosis not present

## 2013-12-23 DIAGNOSIS — F339 Major depressive disorder, recurrent, unspecified: Secondary | ICD-10-CM | POA: Diagnosis not present

## 2013-12-29 DIAGNOSIS — F339 Major depressive disorder, recurrent, unspecified: Secondary | ICD-10-CM | POA: Diagnosis not present

## 2014-01-05 DIAGNOSIS — F339 Major depressive disorder, recurrent, unspecified: Secondary | ICD-10-CM | POA: Diagnosis not present

## 2014-01-12 DIAGNOSIS — F339 Major depressive disorder, recurrent, unspecified: Secondary | ICD-10-CM | POA: Diagnosis not present

## 2014-01-19 DIAGNOSIS — F339 Major depressive disorder, recurrent, unspecified: Secondary | ICD-10-CM | POA: Diagnosis not present

## 2014-01-26 DIAGNOSIS — F339 Major depressive disorder, recurrent, unspecified: Secondary | ICD-10-CM | POA: Diagnosis not present

## 2014-02-02 DIAGNOSIS — F339 Major depressive disorder, recurrent, unspecified: Secondary | ICD-10-CM | POA: Diagnosis not present

## 2014-02-16 DIAGNOSIS — F339 Major depressive disorder, recurrent, unspecified: Secondary | ICD-10-CM | POA: Diagnosis not present

## 2014-02-23 DIAGNOSIS — F339 Major depressive disorder, recurrent, unspecified: Secondary | ICD-10-CM | POA: Diagnosis not present

## 2014-03-02 DIAGNOSIS — F339 Major depressive disorder, recurrent, unspecified: Secondary | ICD-10-CM | POA: Diagnosis not present

## 2014-03-09 DIAGNOSIS — F339 Major depressive disorder, recurrent, unspecified: Secondary | ICD-10-CM | POA: Diagnosis not present

## 2014-03-23 DIAGNOSIS — F339 Major depressive disorder, recurrent, unspecified: Secondary | ICD-10-CM | POA: Diagnosis not present

## 2014-03-30 DIAGNOSIS — F339 Major depressive disorder, recurrent, unspecified: Secondary | ICD-10-CM | POA: Diagnosis not present

## 2014-04-06 DIAGNOSIS — F339 Major depressive disorder, recurrent, unspecified: Secondary | ICD-10-CM | POA: Diagnosis not present

## 2014-04-27 DIAGNOSIS — F339 Major depressive disorder, recurrent, unspecified: Secondary | ICD-10-CM | POA: Diagnosis not present

## 2014-05-04 DIAGNOSIS — F339 Major depressive disorder, recurrent, unspecified: Secondary | ICD-10-CM | POA: Diagnosis not present

## 2014-05-12 DIAGNOSIS — R399 Unspecified symptoms and signs involving the genitourinary system: Secondary | ICD-10-CM | POA: Diagnosis not present

## 2014-05-12 DIAGNOSIS — R140 Abdominal distension (gaseous): Secondary | ICD-10-CM | POA: Diagnosis not present

## 2014-05-12 DIAGNOSIS — G4700 Insomnia, unspecified: Secondary | ICD-10-CM | POA: Diagnosis not present

## 2014-05-18 DIAGNOSIS — L400 Psoriasis vulgaris: Secondary | ICD-10-CM | POA: Diagnosis not present

## 2014-05-18 DIAGNOSIS — L900 Lichen sclerosus et atrophicus: Secondary | ICD-10-CM | POA: Diagnosis not present

## 2014-05-24 DIAGNOSIS — R140 Abdominal distension (gaseous): Secondary | ICD-10-CM | POA: Diagnosis not present

## 2014-05-24 DIAGNOSIS — N8320 Unspecified ovarian cysts: Secondary | ICD-10-CM | POA: Diagnosis not present

## 2014-05-24 DIAGNOSIS — R635 Abnormal weight gain: Secondary | ICD-10-CM | POA: Diagnosis not present

## 2014-05-24 DIAGNOSIS — D251 Intramural leiomyoma of uterus: Secondary | ICD-10-CM | POA: Diagnosis not present

## 2014-08-06 DIAGNOSIS — R399 Unspecified symptoms and signs involving the genitourinary system: Secondary | ICD-10-CM | POA: Diagnosis not present

## 2014-08-06 DIAGNOSIS — E785 Hyperlipidemia, unspecified: Secondary | ICD-10-CM | POA: Diagnosis not present

## 2014-08-06 DIAGNOSIS — R5383 Other fatigue: Secondary | ICD-10-CM | POA: Diagnosis not present

## 2014-08-06 DIAGNOSIS — E559 Vitamin D deficiency, unspecified: Secondary | ICD-10-CM | POA: Diagnosis not present

## 2014-08-06 DIAGNOSIS — Z0000 Encounter for general adult medical examination without abnormal findings: Secondary | ICD-10-CM | POA: Diagnosis not present

## 2014-09-20 DIAGNOSIS — F339 Major depressive disorder, recurrent, unspecified: Secondary | ICD-10-CM | POA: Diagnosis not present

## 2014-09-25 DIAGNOSIS — F339 Major depressive disorder, recurrent, unspecified: Secondary | ICD-10-CM | POA: Diagnosis not present

## 2014-09-28 DIAGNOSIS — F339 Major depressive disorder, recurrent, unspecified: Secondary | ICD-10-CM | POA: Diagnosis not present

## 2014-10-06 DIAGNOSIS — F339 Major depressive disorder, recurrent, unspecified: Secondary | ICD-10-CM | POA: Diagnosis not present

## 2014-10-20 DIAGNOSIS — F339 Major depressive disorder, recurrent, unspecified: Secondary | ICD-10-CM | POA: Diagnosis not present

## 2014-10-29 DIAGNOSIS — D692 Other nonthrombocytopenic purpura: Secondary | ICD-10-CM | POA: Diagnosis not present

## 2014-10-29 DIAGNOSIS — R5382 Chronic fatigue, unspecified: Secondary | ICD-10-CM | POA: Diagnosis not present

## 2014-10-29 DIAGNOSIS — L400 Psoriasis vulgaris: Secondary | ICD-10-CM | POA: Diagnosis not present

## 2014-10-29 DIAGNOSIS — F331 Major depressive disorder, recurrent, moderate: Secondary | ICD-10-CM | POA: Diagnosis not present

## 2014-10-29 DIAGNOSIS — M797 Fibromyalgia: Secondary | ICD-10-CM | POA: Diagnosis not present

## 2014-10-29 DIAGNOSIS — L409 Psoriasis, unspecified: Secondary | ICD-10-CM | POA: Diagnosis not present

## 2014-11-03 DIAGNOSIS — F339 Major depressive disorder, recurrent, unspecified: Secondary | ICD-10-CM | POA: Diagnosis not present

## 2014-11-10 DIAGNOSIS — F339 Major depressive disorder, recurrent, unspecified: Secondary | ICD-10-CM | POA: Diagnosis not present

## 2014-11-16 DIAGNOSIS — L718 Other rosacea: Secondary | ICD-10-CM | POA: Diagnosis not present

## 2014-11-16 DIAGNOSIS — L400 Psoriasis vulgaris: Secondary | ICD-10-CM | POA: Diagnosis not present

## 2014-11-16 DIAGNOSIS — L905 Scar conditions and fibrosis of skin: Secondary | ICD-10-CM | POA: Diagnosis not present

## 2014-11-17 DIAGNOSIS — F339 Major depressive disorder, recurrent, unspecified: Secondary | ICD-10-CM | POA: Diagnosis not present

## 2014-11-23 DIAGNOSIS — M545 Low back pain: Secondary | ICD-10-CM | POA: Diagnosis not present

## 2014-11-23 DIAGNOSIS — L409 Psoriasis, unspecified: Secondary | ICD-10-CM | POA: Diagnosis not present

## 2014-11-23 DIAGNOSIS — M2550 Pain in unspecified joint: Secondary | ICD-10-CM | POA: Diagnosis not present

## 2014-11-23 DIAGNOSIS — F4320 Adjustment disorder, unspecified: Secondary | ICD-10-CM | POA: Diagnosis not present

## 2014-11-23 DIAGNOSIS — R5382 Chronic fatigue, unspecified: Secondary | ICD-10-CM | POA: Diagnosis not present

## 2014-11-23 DIAGNOSIS — M797 Fibromyalgia: Secondary | ICD-10-CM | POA: Diagnosis not present

## 2014-11-24 DIAGNOSIS — F339 Major depressive disorder, recurrent, unspecified: Secondary | ICD-10-CM | POA: Diagnosis not present

## 2014-11-30 DIAGNOSIS — F339 Major depressive disorder, recurrent, unspecified: Secondary | ICD-10-CM | POA: Diagnosis not present

## 2014-12-07 DIAGNOSIS — F339 Major depressive disorder, recurrent, unspecified: Secondary | ICD-10-CM | POA: Diagnosis not present

## 2014-12-15 DIAGNOSIS — F339 Major depressive disorder, recurrent, unspecified: Secondary | ICD-10-CM | POA: Diagnosis not present

## 2014-12-22 DIAGNOSIS — F339 Major depressive disorder, recurrent, unspecified: Secondary | ICD-10-CM | POA: Diagnosis not present

## 2014-12-29 DIAGNOSIS — F339 Major depressive disorder, recurrent, unspecified: Secondary | ICD-10-CM | POA: Diagnosis not present

## 2015-01-12 DIAGNOSIS — F339 Major depressive disorder, recurrent, unspecified: Secondary | ICD-10-CM | POA: Diagnosis not present

## 2015-01-18 DIAGNOSIS — M797 Fibromyalgia: Secondary | ICD-10-CM | POA: Diagnosis not present

## 2015-01-18 DIAGNOSIS — F331 Major depressive disorder, recurrent, moderate: Secondary | ICD-10-CM | POA: Diagnosis not present

## 2015-01-19 DIAGNOSIS — F339 Major depressive disorder, recurrent, unspecified: Secondary | ICD-10-CM | POA: Diagnosis not present

## 2015-02-02 DIAGNOSIS — F339 Major depressive disorder, recurrent, unspecified: Secondary | ICD-10-CM | POA: Diagnosis not present

## 2015-02-09 ENCOUNTER — Other Ambulatory Visit: Payer: Self-pay | Admitting: Family Medicine

## 2015-02-09 ENCOUNTER — Ambulatory Visit
Admission: RE | Admit: 2015-02-09 | Discharge: 2015-02-09 | Disposition: A | Discharge status: Home / Self Care | Payer: Medicare Other | Source: Ambulatory Visit | Attending: Family Medicine | Admitting: Family Medicine

## 2015-02-09 DIAGNOSIS — M47816 Spondylosis without myelopathy or radiculopathy, lumbar region: Secondary | ICD-10-CM | POA: Diagnosis not present

## 2015-02-09 DIAGNOSIS — F339 Major depressive disorder, recurrent, unspecified: Secondary | ICD-10-CM | POA: Diagnosis not present

## 2015-02-09 DIAGNOSIS — S3992XA Unspecified injury of lower back, initial encounter: Secondary | ICD-10-CM

## 2015-02-10 DIAGNOSIS — R5383 Other fatigue: Secondary | ICD-10-CM | POA: Diagnosis not present

## 2015-02-10 DIAGNOSIS — A6929 Other conditions associated with Lyme disease: Secondary | ICD-10-CM | POA: Diagnosis not present

## 2015-02-10 DIAGNOSIS — E538 Deficiency of other specified B group vitamins: Secondary | ICD-10-CM | POA: Diagnosis not present

## 2015-02-16 DIAGNOSIS — F339 Major depressive disorder, recurrent, unspecified: Secondary | ICD-10-CM | POA: Diagnosis not present

## 2015-02-23 DIAGNOSIS — F339 Major depressive disorder, recurrent, unspecified: Secondary | ICD-10-CM | POA: Diagnosis not present

## 2015-03-02 DIAGNOSIS — F339 Major depressive disorder, recurrent, unspecified: Secondary | ICD-10-CM | POA: Diagnosis not present

## 2015-03-09 DIAGNOSIS — F339 Major depressive disorder, recurrent, unspecified: Secondary | ICD-10-CM | POA: Diagnosis not present

## 2015-03-16 DIAGNOSIS — F339 Major depressive disorder, recurrent, unspecified: Secondary | ICD-10-CM | POA: Diagnosis not present

## 2015-03-23 DIAGNOSIS — F339 Major depressive disorder, recurrent, unspecified: Secondary | ICD-10-CM | POA: Diagnosis not present

## 2015-03-25 DIAGNOSIS — Z5181 Encounter for therapeutic drug level monitoring: Secondary | ICD-10-CM | POA: Diagnosis not present

## 2015-03-25 DIAGNOSIS — R5383 Other fatigue: Secondary | ICD-10-CM | POA: Diagnosis not present

## 2015-03-25 DIAGNOSIS — E039 Hypothyroidism, unspecified: Secondary | ICD-10-CM | POA: Diagnosis not present

## 2015-03-30 DIAGNOSIS — F339 Major depressive disorder, recurrent, unspecified: Secondary | ICD-10-CM | POA: Diagnosis not present

## 2015-04-06 DIAGNOSIS — F339 Major depressive disorder, recurrent, unspecified: Secondary | ICD-10-CM | POA: Diagnosis not present

## 2015-04-15 DIAGNOSIS — F339 Major depressive disorder, recurrent, unspecified: Secondary | ICD-10-CM | POA: Diagnosis not present

## 2015-04-29 DIAGNOSIS — F339 Major depressive disorder, recurrent, unspecified: Secondary | ICD-10-CM | POA: Diagnosis not present

## 2015-05-04 DIAGNOSIS — E039 Hypothyroidism, unspecified: Secondary | ICD-10-CM | POA: Diagnosis not present

## 2015-05-04 DIAGNOSIS — R5383 Other fatigue: Secondary | ICD-10-CM | POA: Diagnosis not present

## 2015-05-04 DIAGNOSIS — Z5181 Encounter for therapeutic drug level monitoring: Secondary | ICD-10-CM | POA: Diagnosis not present

## 2015-05-06 DIAGNOSIS — F339 Major depressive disorder, recurrent, unspecified: Secondary | ICD-10-CM | POA: Diagnosis not present

## 2015-05-13 DIAGNOSIS — F339 Major depressive disorder, recurrent, unspecified: Secondary | ICD-10-CM | POA: Diagnosis not present

## 2015-05-20 DIAGNOSIS — F339 Major depressive disorder, recurrent, unspecified: Secondary | ICD-10-CM | POA: Diagnosis not present

## 2015-05-31 ENCOUNTER — Other Ambulatory Visit: Payer: Self-pay | Admitting: Family Medicine

## 2015-05-31 DIAGNOSIS — N83209 Unspecified ovarian cyst, unspecified side: Secondary | ICD-10-CM

## 2015-05-31 DIAGNOSIS — L400 Psoriasis vulgaris: Secondary | ICD-10-CM | POA: Diagnosis not present

## 2015-06-01 DIAGNOSIS — L400 Psoriasis vulgaris: Secondary | ICD-10-CM | POA: Diagnosis not present

## 2015-06-03 DIAGNOSIS — L400 Psoriasis vulgaris: Secondary | ICD-10-CM | POA: Diagnosis not present

## 2015-06-03 DIAGNOSIS — F339 Major depressive disorder, recurrent, unspecified: Secondary | ICD-10-CM | POA: Diagnosis not present

## 2015-06-06 DIAGNOSIS — L400 Psoriasis vulgaris: Secondary | ICD-10-CM | POA: Diagnosis not present

## 2015-06-08 ENCOUNTER — Ambulatory Visit
Admission: RE | Admit: 2015-06-08 | Discharge: 2015-06-08 | Disposition: A | Discharge status: Home / Self Care | Payer: Medicare Other | Source: Ambulatory Visit | Attending: Family Medicine | Admitting: Family Medicine

## 2015-06-08 DIAGNOSIS — N83209 Unspecified ovarian cyst, unspecified side: Secondary | ICD-10-CM

## 2015-06-08 DIAGNOSIS — N83201 Unspecified ovarian cyst, right side: Secondary | ICD-10-CM | POA: Diagnosis not present

## 2015-06-08 DIAGNOSIS — L400 Psoriasis vulgaris: Secondary | ICD-10-CM | POA: Diagnosis not present

## 2015-06-13 DIAGNOSIS — L400 Psoriasis vulgaris: Secondary | ICD-10-CM | POA: Diagnosis not present

## 2015-06-15 DIAGNOSIS — L400 Psoriasis vulgaris: Secondary | ICD-10-CM | POA: Diagnosis not present

## 2015-06-17 DIAGNOSIS — L400 Psoriasis vulgaris: Secondary | ICD-10-CM | POA: Diagnosis not present

## 2015-06-20 DIAGNOSIS — L400 Psoriasis vulgaris: Secondary | ICD-10-CM | POA: Diagnosis not present

## 2015-06-22 DIAGNOSIS — L400 Psoriasis vulgaris: Secondary | ICD-10-CM | POA: Diagnosis not present

## 2015-06-24 DIAGNOSIS — L400 Psoriasis vulgaris: Secondary | ICD-10-CM | POA: Diagnosis not present

## 2015-06-27 DIAGNOSIS — L400 Psoriasis vulgaris: Secondary | ICD-10-CM | POA: Diagnosis not present

## 2015-06-29 DIAGNOSIS — L400 Psoriasis vulgaris: Secondary | ICD-10-CM | POA: Diagnosis not present

## 2015-07-04 DIAGNOSIS — L400 Psoriasis vulgaris: Secondary | ICD-10-CM | POA: Diagnosis not present

## 2015-07-06 DIAGNOSIS — L400 Psoriasis vulgaris: Secondary | ICD-10-CM | POA: Diagnosis not present

## 2015-07-08 DIAGNOSIS — L400 Psoriasis vulgaris: Secondary | ICD-10-CM | POA: Diagnosis not present

## 2015-07-11 DIAGNOSIS — L400 Psoriasis vulgaris: Secondary | ICD-10-CM | POA: Diagnosis not present

## 2015-07-13 DIAGNOSIS — L400 Psoriasis vulgaris: Secondary | ICD-10-CM | POA: Diagnosis not present

## 2015-07-15 DIAGNOSIS — L400 Psoriasis vulgaris: Secondary | ICD-10-CM | POA: Diagnosis not present

## 2015-07-18 DIAGNOSIS — L400 Psoriasis vulgaris: Secondary | ICD-10-CM | POA: Diagnosis not present

## 2015-07-20 DIAGNOSIS — L400 Psoriasis vulgaris: Secondary | ICD-10-CM | POA: Diagnosis not present

## 2015-07-22 DIAGNOSIS — L400 Psoriasis vulgaris: Secondary | ICD-10-CM | POA: Diagnosis not present

## 2015-07-25 DIAGNOSIS — L400 Psoriasis vulgaris: Secondary | ICD-10-CM | POA: Diagnosis not present

## 2015-07-27 DIAGNOSIS — L400 Psoriasis vulgaris: Secondary | ICD-10-CM | POA: Diagnosis not present

## 2015-07-29 DIAGNOSIS — D692 Other nonthrombocytopenic purpura: Secondary | ICD-10-CM | POA: Diagnosis not present

## 2015-07-29 DIAGNOSIS — L400 Psoriasis vulgaris: Secondary | ICD-10-CM | POA: Diagnosis not present

## 2015-08-02 DIAGNOSIS — L400 Psoriasis vulgaris: Secondary | ICD-10-CM | POA: Diagnosis not present

## 2015-08-04 DIAGNOSIS — L400 Psoriasis vulgaris: Secondary | ICD-10-CM | POA: Diagnosis not present

## 2015-08-08 DIAGNOSIS — L400 Psoriasis vulgaris: Secondary | ICD-10-CM | POA: Diagnosis not present

## 2015-08-10 DIAGNOSIS — L400 Psoriasis vulgaris: Secondary | ICD-10-CM | POA: Diagnosis not present

## 2015-08-12 DIAGNOSIS — L400 Psoriasis vulgaris: Secondary | ICD-10-CM | POA: Diagnosis not present

## 2015-08-15 DIAGNOSIS — L400 Psoriasis vulgaris: Secondary | ICD-10-CM | POA: Diagnosis not present

## 2015-08-17 DIAGNOSIS — Z1159 Encounter for screening for other viral diseases: Secondary | ICD-10-CM | POA: Diagnosis not present

## 2015-08-17 DIAGNOSIS — R946 Abnormal results of thyroid function studies: Secondary | ICD-10-CM | POA: Diagnosis not present

## 2015-08-17 DIAGNOSIS — E785 Hyperlipidemia, unspecified: Secondary | ICD-10-CM | POA: Diagnosis not present

## 2015-08-17 DIAGNOSIS — T148 Other injury of unspecified body region: Secondary | ICD-10-CM | POA: Diagnosis not present

## 2015-08-17 DIAGNOSIS — R5382 Chronic fatigue, unspecified: Secondary | ICD-10-CM | POA: Diagnosis not present

## 2015-08-17 DIAGNOSIS — E538 Deficiency of other specified B group vitamins: Secondary | ICD-10-CM | POA: Diagnosis not present

## 2015-08-17 DIAGNOSIS — Z0000 Encounter for general adult medical examination without abnormal findings: Secondary | ICD-10-CM | POA: Diagnosis not present

## 2015-08-18 DIAGNOSIS — L400 Psoriasis vulgaris: Secondary | ICD-10-CM | POA: Diagnosis not present

## 2015-08-22 DIAGNOSIS — L400 Psoriasis vulgaris: Secondary | ICD-10-CM | POA: Diagnosis not present

## 2015-08-24 DIAGNOSIS — L400 Psoriasis vulgaris: Secondary | ICD-10-CM | POA: Diagnosis not present

## 2015-08-26 DIAGNOSIS — L400 Psoriasis vulgaris: Secondary | ICD-10-CM | POA: Diagnosis not present

## 2015-08-29 DIAGNOSIS — L400 Psoriasis vulgaris: Secondary | ICD-10-CM | POA: Diagnosis not present

## 2015-08-31 DIAGNOSIS — L400 Psoriasis vulgaris: Secondary | ICD-10-CM | POA: Diagnosis not present

## 2015-09-02 DIAGNOSIS — L400 Psoriasis vulgaris: Secondary | ICD-10-CM | POA: Diagnosis not present

## 2015-09-05 DIAGNOSIS — L400 Psoriasis vulgaris: Secondary | ICD-10-CM | POA: Diagnosis not present

## 2015-09-07 DIAGNOSIS — L400 Psoriasis vulgaris: Secondary | ICD-10-CM | POA: Diagnosis not present

## 2015-09-12 DIAGNOSIS — L400 Psoriasis vulgaris: Secondary | ICD-10-CM | POA: Diagnosis not present

## 2015-09-14 DIAGNOSIS — L400 Psoriasis vulgaris: Secondary | ICD-10-CM | POA: Diagnosis not present

## 2015-09-16 DIAGNOSIS — L400 Psoriasis vulgaris: Secondary | ICD-10-CM | POA: Diagnosis not present

## 2015-09-20 DIAGNOSIS — L400 Psoriasis vulgaris: Secondary | ICD-10-CM | POA: Diagnosis not present

## 2015-09-22 DIAGNOSIS — L400 Psoriasis vulgaris: Secondary | ICD-10-CM | POA: Diagnosis not present

## 2015-09-26 DIAGNOSIS — L400 Psoriasis vulgaris: Secondary | ICD-10-CM | POA: Diagnosis not present

## 2015-09-28 DIAGNOSIS — L400 Psoriasis vulgaris: Secondary | ICD-10-CM | POA: Diagnosis not present

## 2015-09-28 DIAGNOSIS — D692 Other nonthrombocytopenic purpura: Secondary | ICD-10-CM | POA: Diagnosis not present

## 2015-09-30 DIAGNOSIS — L400 Psoriasis vulgaris: Secondary | ICD-10-CM | POA: Diagnosis not present

## 2015-10-03 DIAGNOSIS — L400 Psoriasis vulgaris: Secondary | ICD-10-CM | POA: Diagnosis not present

## 2015-10-04 DIAGNOSIS — R5383 Other fatigue: Secondary | ICD-10-CM | POA: Diagnosis not present

## 2015-10-04 DIAGNOSIS — E559 Vitamin D deficiency, unspecified: Secondary | ICD-10-CM | POA: Diagnosis not present

## 2015-10-05 DIAGNOSIS — L400 Psoriasis vulgaris: Secondary | ICD-10-CM | POA: Diagnosis not present

## 2015-10-07 DIAGNOSIS — L400 Psoriasis vulgaris: Secondary | ICD-10-CM | POA: Diagnosis not present

## 2015-10-10 DIAGNOSIS — L400 Psoriasis vulgaris: Secondary | ICD-10-CM | POA: Diagnosis not present

## 2015-10-12 DIAGNOSIS — L400 Psoriasis vulgaris: Secondary | ICD-10-CM | POA: Diagnosis not present

## 2015-10-14 DIAGNOSIS — L400 Psoriasis vulgaris: Secondary | ICD-10-CM | POA: Diagnosis not present

## 2015-10-17 DIAGNOSIS — L400 Psoriasis vulgaris: Secondary | ICD-10-CM | POA: Diagnosis not present

## 2015-10-19 DIAGNOSIS — L400 Psoriasis vulgaris: Secondary | ICD-10-CM | POA: Diagnosis not present

## 2015-10-21 DIAGNOSIS — L400 Psoriasis vulgaris: Secondary | ICD-10-CM | POA: Diagnosis not present

## 2015-10-24 DIAGNOSIS — L400 Psoriasis vulgaris: Secondary | ICD-10-CM | POA: Diagnosis not present

## 2015-10-26 DIAGNOSIS — L400 Psoriasis vulgaris: Secondary | ICD-10-CM | POA: Diagnosis not present

## 2015-10-28 DIAGNOSIS — L400 Psoriasis vulgaris: Secondary | ICD-10-CM | POA: Diagnosis not present

## 2015-10-31 DIAGNOSIS — L400 Psoriasis vulgaris: Secondary | ICD-10-CM | POA: Diagnosis not present

## 2015-11-02 DIAGNOSIS — L400 Psoriasis vulgaris: Secondary | ICD-10-CM | POA: Diagnosis not present

## 2015-11-04 DIAGNOSIS — L400 Psoriasis vulgaris: Secondary | ICD-10-CM | POA: Diagnosis not present

## 2015-11-07 DIAGNOSIS — L400 Psoriasis vulgaris: Secondary | ICD-10-CM | POA: Diagnosis not present

## 2015-11-09 DIAGNOSIS — L400 Psoriasis vulgaris: Secondary | ICD-10-CM | POA: Diagnosis not present

## 2015-11-11 DIAGNOSIS — L400 Psoriasis vulgaris: Secondary | ICD-10-CM | POA: Diagnosis not present

## 2015-11-16 DIAGNOSIS — L400 Psoriasis vulgaris: Secondary | ICD-10-CM | POA: Diagnosis not present

## 2015-11-18 DIAGNOSIS — L400 Psoriasis vulgaris: Secondary | ICD-10-CM | POA: Diagnosis not present

## 2015-11-21 DIAGNOSIS — L400 Psoriasis vulgaris: Secondary | ICD-10-CM | POA: Diagnosis not present

## 2015-11-25 DIAGNOSIS — L400 Psoriasis vulgaris: Secondary | ICD-10-CM | POA: Diagnosis not present

## 2015-11-28 DIAGNOSIS — L400 Psoriasis vulgaris: Secondary | ICD-10-CM | POA: Diagnosis not present

## 2015-11-30 DIAGNOSIS — L400 Psoriasis vulgaris: Secondary | ICD-10-CM | POA: Diagnosis not present

## 2015-11-30 DIAGNOSIS — L718 Other rosacea: Secondary | ICD-10-CM | POA: Diagnosis not present

## 2015-11-30 DIAGNOSIS — L720 Epidermal cyst: Secondary | ICD-10-CM | POA: Diagnosis not present

## 2015-11-30 DIAGNOSIS — D692 Other nonthrombocytopenic purpura: Secondary | ICD-10-CM | POA: Diagnosis not present

## 2015-11-30 DIAGNOSIS — L810 Postinflammatory hyperpigmentation: Secondary | ICD-10-CM | POA: Diagnosis not present

## 2015-12-09 DIAGNOSIS — L400 Psoriasis vulgaris: Secondary | ICD-10-CM | POA: Diagnosis not present

## 2015-12-12 ENCOUNTER — Other Ambulatory Visit: Payer: Self-pay | Admitting: Gastroenterology

## 2015-12-12 DIAGNOSIS — R1013 Epigastric pain: Secondary | ICD-10-CM | POA: Diagnosis not present

## 2015-12-12 DIAGNOSIS — R109 Unspecified abdominal pain: Secondary | ICD-10-CM

## 2015-12-12 DIAGNOSIS — R198 Other specified symptoms and signs involving the digestive system and abdomen: Secondary | ICD-10-CM | POA: Diagnosis not present

## 2015-12-16 DIAGNOSIS — L400 Psoriasis vulgaris: Secondary | ICD-10-CM | POA: Diagnosis not present

## 2015-12-23 ENCOUNTER — Other Ambulatory Visit: Payer: Medicare Other

## 2015-12-23 DIAGNOSIS — L400 Psoriasis vulgaris: Secondary | ICD-10-CM | POA: Diagnosis not present

## 2015-12-27 ENCOUNTER — Ambulatory Visit
Admission: RE | Admit: 2015-12-27 | Discharge: 2015-12-27 | Disposition: A | Discharge status: Home / Self Care | Payer: Medicare Other | Source: Ambulatory Visit | Attending: Gastroenterology | Admitting: Gastroenterology

## 2015-12-27 DIAGNOSIS — K824 Cholesterolosis of gallbladder: Secondary | ICD-10-CM | POA: Diagnosis not present

## 2015-12-27 DIAGNOSIS — R109 Unspecified abdominal pain: Secondary | ICD-10-CM

## 2016-01-02 DIAGNOSIS — L400 Psoriasis vulgaris: Secondary | ICD-10-CM | POA: Diagnosis not present

## 2016-01-12 DIAGNOSIS — L400 Psoriasis vulgaris: Secondary | ICD-10-CM | POA: Diagnosis not present

## 2016-01-13 DIAGNOSIS — F339 Major depressive disorder, recurrent, unspecified: Secondary | ICD-10-CM | POA: Diagnosis not present

## 2016-01-17 DIAGNOSIS — G8929 Other chronic pain: Secondary | ICD-10-CM | POA: Diagnosis not present

## 2016-01-17 DIAGNOSIS — F331 Major depressive disorder, recurrent, moderate: Secondary | ICD-10-CM | POA: Diagnosis not present

## 2016-01-17 DIAGNOSIS — M545 Low back pain: Secondary | ICD-10-CM | POA: Diagnosis not present

## 2016-01-19 ENCOUNTER — Other Ambulatory Visit: Payer: Self-pay | Admitting: Family Medicine

## 2016-01-19 DIAGNOSIS — G8929 Other chronic pain: Secondary | ICD-10-CM

## 2016-01-19 DIAGNOSIS — M545 Low back pain: Secondary | ICD-10-CM

## 2016-01-20 DIAGNOSIS — F339 Major depressive disorder, recurrent, unspecified: Secondary | ICD-10-CM | POA: Diagnosis not present

## 2016-01-23 DIAGNOSIS — L400 Psoriasis vulgaris: Secondary | ICD-10-CM | POA: Diagnosis not present

## 2016-01-27 DIAGNOSIS — F339 Major depressive disorder, recurrent, unspecified: Secondary | ICD-10-CM | POA: Diagnosis not present

## 2016-01-28 ENCOUNTER — Ambulatory Visit
Admission: RE | Admit: 2016-01-28 | Discharge: 2016-01-28 | Disposition: A | Discharge status: Home / Self Care | Payer: Medicare Other | Source: Ambulatory Visit | Attending: Family Medicine | Admitting: Family Medicine

## 2016-01-28 DIAGNOSIS — G8929 Other chronic pain: Secondary | ICD-10-CM

## 2016-01-28 DIAGNOSIS — M545 Low back pain, unspecified: Secondary | ICD-10-CM

## 2016-01-28 DIAGNOSIS — M48061 Spinal stenosis, lumbar region without neurogenic claudication: Secondary | ICD-10-CM | POA: Diagnosis not present

## 2016-01-29 ENCOUNTER — Other Ambulatory Visit: Payer: Medicare Other

## 2016-02-02 DIAGNOSIS — F339 Major depressive disorder, recurrent, unspecified: Secondary | ICD-10-CM | POA: Diagnosis not present

## 2016-02-03 DIAGNOSIS — L400 Psoriasis vulgaris: Secondary | ICD-10-CM | POA: Diagnosis not present

## 2016-02-10 DIAGNOSIS — F339 Major depressive disorder, recurrent, unspecified: Secondary | ICD-10-CM | POA: Diagnosis not present

## 2016-02-13 DIAGNOSIS — Z1211 Encounter for screening for malignant neoplasm of colon: Secondary | ICD-10-CM | POA: Diagnosis not present

## 2016-02-13 DIAGNOSIS — L400 Psoriasis vulgaris: Secondary | ICD-10-CM | POA: Diagnosis not present

## 2016-02-17 DIAGNOSIS — F339 Major depressive disorder, recurrent, unspecified: Secondary | ICD-10-CM | POA: Diagnosis not present

## 2016-02-20 DIAGNOSIS — L400 Psoriasis vulgaris: Secondary | ICD-10-CM | POA: Diagnosis not present

## 2016-02-21 ENCOUNTER — Ambulatory Visit: Payer: Medicare Other | Attending: Family Medicine | Admitting: Physical Therapy

## 2016-02-21 DIAGNOSIS — G8929 Other chronic pain: Secondary | ICD-10-CM

## 2016-02-21 DIAGNOSIS — M545 Low back pain, unspecified: Secondary | ICD-10-CM

## 2016-02-21 DIAGNOSIS — M6281 Muscle weakness (generalized): Secondary | ICD-10-CM | POA: Diagnosis not present

## 2016-02-21 DIAGNOSIS — M62830 Muscle spasm of back: Secondary | ICD-10-CM | POA: Insufficient documentation

## 2016-02-21 NOTE — Therapy (Signed)
[facility] [address], [place], [place], [address] Phone: [phone]   Fax:  [phone]  Physical Therapy Evaluation  Patient Details  Name: Sherry Thompson MRN: [id number] Date of Birth: [birth year] Referring Provider: Dr. Justin Mend  Encounter Date: 02/21/2016      PT End of Session - 02/21/16 2111    Visit Number 1   Number of Visits 10   Date for PT Re-Evaluation 04/17/16   Authorization Type Medicare G codes;  KX at visit 15   PT Start Time 1535   PT Stop Time 1620   PT Time Calculation (min) 45 min   Activity Tolerance Patient tolerated treatment well      Past Medical History:  Diagnosis Date  . Chronic fatigue syndrome   . Depression   . Fibromyalgia   . Osteopenia   . Psoriasis     No past surgical history on file.  There were no vitals filed for this visit.       Subjective Assessment - 02/21/16 1548    Subjective I brought a copy of my MRI;  years of back pain since 1999 when diagnosed with fibromyalgia;  took yoga at [facility] but last time I went, but when laid mat down I spent the whole hour crying.  Reaching triggers spasms.  Valium and wine helps.  Spasms happen 1x/month and the next 3 days are shot.  Had 2nd MRI.  Yoga on hold now.  2-3years ago had spasm so bad had to go to hospital by ambulance;  Had acupunture 1x and increased pain.     Pertinent History osteopenia;  fibromyalgia;  chronic fatigue syndrome   Limitations Sitting;House hold activities;Lifting   How long can you sit comfortably? 1 hour   How long can you walk comfortably? 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 Yes   Pain Score 5    Pain Location Back   Pain Orientation Lower;Right;Left   Pain Type Chronic pain   Pain Radiating Towards none   Pain  Onset More than a month ago   Aggravating Factors  sitting; when I round it;     Pain Relieving Factors Valium, wine; heat            [facility] PT Assessment - 02/21/16 0001      Assessment   Medical Diagnosis lumbar DJD   Referring Provider Dr. Justin Mend   Onset Date/Surgical Date --  6 weeks   Next MD Visit not scheduled   Prior Therapy had chiro, acupuncture worsened;  Had PT and didn't relate to his personality     Precautions   Precautions None     Restrictions   Weight Bearing Restrictions No     Balance Screen   Has the patient fallen in the past 6 months No   Has the patient had a decrease in activity level because of a fear of falling?  No   Is the patient reluctant to leave their home because of a fear of falling?  No     Home Environment   Living Environment Private residence   Type of [place] to enter   Home Layout One level     Prior [place] Retired   Biomedical scientist retired  pharmacist   Leisure used to play tennis but not any more;  go out with friends  trash to treasure     Observation/Other Assessments   Focus on Therapeutic Outcomes (FOTO)  51% limitation      Posture/Postural Control   Posture/Postural Control Postural limitations   Postural Limitations Decreased lumbar lordosis     ROM / Strength   AROM / PROM / Strength AROM     AROM   AROM Assessment Site Lumbar   Lumbar Flexion 60   Lumbar Extension 10  pinches   Lumbar - Right Side Bend 25   Lumbar - Left Side Bend 25     Strength   Strength Assessment Site Hip;Lumbar   Right/Left Hip Right;Left   Right Hip ABduction 4-/5   Left Hip ADduction 4-/5   Lumbar Flexion 3/5   Lumbar Extension 3/5     Flexibility   Soft Tissue Assessment /Muscle Length yes   Hamstrings decreased bilaterally     Palpation   Palpation comment no tenderness lumbar paraspinals or gluteals but patient not in spasm today                   [facility] Adult PT  Treatment/Exercise - 02/21/16 0001      Self-Care   Self-Care Other Self-Care Comments   Other Self-Care Comments  discussion of neuroscience of pain and MRI findings per patient report     Lumbar Exercises: Supine   Ab Set 10 reps                PT Education - 02/21/16 2109    Education provided Yes          PT Short Term Goals - 02/21/16 2134      PT SHORT TERM GOAL #1   Title The patient will demonstrate basic understanding of postural correction, osteopenia precautions and basic HEP  03/20/16   Time 4   Period Weeks   Status New     PT SHORT TERM GOAL #2   Title The patient will report a 25% reduction with usual ADLs   Time 4   Period Weeks   Status New     PT SHORT TERM GOAL #3   Title The patient will be able to activate transverse abdominals for core stabilization without doing a pelvic tilt or Valsalva maneuver   Time 4   Period Weeks   Status New           PT Long Term Goals - 02/21/16 2138      PT LONG TERM GOAL #1   Title The patient will be independent in safe self progression of HEP and/or return to yoga    04/17/16   Time 8   Period Weeks   Status New     PT LONG TERM GOAL #2   Title The patient will report a 50% improvement in daily pain and reduction of spasms   Time 8   Period Weeks   Status New     PT LONG TERM GOAL #3   Title The patient will demonstrate knowledge of proper body mechanics with ADLs for self management of chronic back pain and osteopenia   Time 8   Period Weeks   Status New     PT LONG TERM GOAL #4   Title The patient will have core and hip strength at least 4-/5 needed for household ADLs    Time 8   Period Weeks   Status New  PT LONG TERM GOAL #5   Title FOTO functional outcome score improved from 51% limitation to 43% indicating improved function with less pain   Time 8   Period Weeks   Status New               Plan - 02/21/16 08/16/2110    Clinical Impression Statement The patient has a  very long history of LBP with complaints of a recent worsening of symptoms when she tried to join a yoga class.  She states just putting her mat on the floor and lying down caused her to have severe spasms and she layed on the mat "and cried for the hour."  She states just reaching will send her low back into spasms and this will happen about once a month.   She recently had an MRI which showed multi-level mild DDD and DJD.  She is eager to discuss these findings and an extensive period of time was spent discussing these changes and typical spinal changes with each decade of life.  No radiating symptoms.  Decreased lumbar AROM in all planes, patient complains of a worsening of symptoms with lumbar extension.  Decreased activation of transverse abdominals and lumbar multifidi 3/5.  Decreased gluteal strength 4-/5.  She would benefit from PT to address these deficits.  She is fearful of physical activity.   She is of moderate level complexity secondary to evolving condition and numerous co-morbidities including osteopenia, fibromyalgia and chronic fatigue syndrome.     Rehab Potential Good   Clinical Impairments Affecting Rehab Potential osteopenia;  fibromyalgia;  chronic fatigue syndrome;  has TENS unit at home; prone to severe spasms;  had major flare up with acupuncture in the past   PT Frequency 2x / week   PT Duration 8 weeks   PT Treatment/Interventions ADLs/Self Care Home Management;Electrical Stimulation;Ultrasound;Moist Heat;Traction;Patient/family education;Therapeutic exercise;Taping;Manual techniques   PT Next Visit Plan proceed very slowly with ex progression secondary to fibromyalgia and prone to flare ups;  neuroscience of pain;  abdominal brace progression and decompression series;  body mechanics training;  osteopenia education       Patient will benefit from skilled therapeutic intervention in order to improve the following deficits and impairments:  Decreased range of motion, Decreased  strength, Increased muscle spasms, Pain  Visit Diagnosis: Chronic bilateral low back pain without sciatica - Plan: PT plan of care cert/re-cert  Muscle spasm of back - Plan: PT plan of care cert/re-cert  Muscle weakness (generalized) - Plan: PT plan of care cert/re-cert      G-Codes - [id number] 08-15-40    Functional Assessment Tool Used FOTO ; clinical judgement    Functional Limitation Mobility: Walking and moving around   Mobility: Walking and Moving Around Current Status [phone]) At least 40 percent but less than 60 percent impaired, limited or restricted   Mobility: Walking and Moving Around Goal Status [phone]) At least 20 percent but less than 40 percent impaired, limited or restricted       Problem List There are no active problems to display for this patient.   [name] 02/21/2016, 9:45 PM  [facility] [address] [place], [place], [address] Phone: [phone]   Fax:  [phone]  Name: SHALIE TREDWAY MRN: [id number] Date of Birth: December 07, 1945

## 2016-02-21 NOTE — Therapy (Signed)
[facility] [address], [place], [place], [address] Phone: [phone]   Fax:  [phone]  Physical Therapy Evaluation  Patient Details  Name: Sherry Thompson MRN: [id number] Date of Birth: Oct 05, 1945 Referring Provider: Dr. Justin Mend  Encounter Date: 02/21/2016      PT End of Session - 02/21/16 2111    Visit Number 1   Number of Visits 10   Date for PT Re-Evaluation 04/17/16   Authorization Type Medicare G codes;  KX at visit 15   PT Start Time 1535   PT Stop Time 1620   PT Time Calculation (min) 45 min   Activity Tolerance Patient tolerated treatment well      Past Medical History:  Diagnosis Date  . Chronic fatigue syndrome   . Depression   . Fibromyalgia   . Osteopenia   . Psoriasis     No past surgical history on file.  There were no vitals filed for this visit.       Subjective Assessment - 02/21/16 1548    Subjective I brought a copy of my MRI;  years of back pain since 1999 when diagnosed with fibromyalgia;  took yoga at [facility] but last time I went, but when laid mat down I spent the whole hour crying.  Reaching triggers spasms.  Valium and wine helps.  Spasms happen 1x/month and the next 3 days are shot.  Had 2nd MRI.  Yoga on hold now.  2-3years ago had spasm so bad had to go to hospital by ambulance;  Had acupunture 1x and increased pain.     Pertinent History osteopenia;  fibromyalgia;  chronic fatigue syndrome   Limitations Sitting;House hold activities;Lifting   How long can you sit comfortably? 1 hour   How long can you walk comfortably? 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 Yes   Pain Score 5    Pain Location Back   Pain Orientation Lower;Right;Left   Pain Type Chronic pain   Pain Radiating Towards none   Pain  Onset More than a month ago   Aggravating Factors  sitting; when I round it;     Pain Relieving Factors Valium, wine; heat            [facility] PT Assessment - 02/21/16 0001      Assessment   Medical Diagnosis lumbar DJD   Referring Provider Dr. Justin Mend   Onset Date/Surgical Date --  6 weeks   Next MD Visit not scheduled   Prior Therapy had chiro, acupuncture worsened;  Had PT and didn't relate to his personality     Precautions   Precautions None     Restrictions   Weight Bearing Restrictions No     Balance Screen   Has the patient fallen in the past 6 months No   Has the patient had a decrease in activity level because of a fear of falling?  No   Is the patient reluctant to leave their home because of a fear of falling?  No     Home Environment   Living Environment Private residence   Type of [place] to enter   Home Layout One level     Prior [place] Retired   Biomedical scientist retired  pharmacist   Leisure used to play tennis but not any more;  go out with friends  trash to treasure     Observation/Other Assessments   Focus on Therapeutic Outcomes (FOTO)  51% limitation      Posture/Postural Control   Posture/Postural Control Postural limitations   Postural Limitations Decreased lumbar lordosis     ROM / Strength   AROM / PROM / Strength AROM     AROM   AROM Assessment Site Lumbar   Lumbar Flexion 60   Lumbar Extension 10  pinches   Lumbar - Right Side Bend 25   Lumbar - Left Side Bend 25     Strength   Strength Assessment Site Hip;Lumbar   Right/Left Hip Right;Left   Right Hip ABduction 4-/5   Left Hip ADduction 4-/5   Lumbar Flexion 3/5   Lumbar Extension 3/5     Flexibility   Soft Tissue Assessment /Muscle Length yes   Hamstrings decreased bilaterally     Palpation   Palpation comment no tenderness lumbar paraspinals or gluteals but patient not in spasm today                   [facility] Adult PT  Treatment/Exercise - 02/21/16 0001      Self-Care   Self-Care Other Self-Care Comments   Other Self-Care Comments  discussion of neuroscience of pain and MRI findings per patient report     Lumbar Exercises: Supine   Ab Set 10 reps                PT Education - 02/21/16 2109    Education provided Yes   Education Details abdominal brace in supine;  spinal anatomy and normal DDD/DJD with age   Person(s) Educated Patient   Methods Explanation   Comprehension Verbalized understanding          PT Short Term Goals - 02/21/16 2134      PT SHORT TERM GOAL #1   Title The patient will demonstrate basic understanding of postural correction, osteopenia precautions and basic HEP  03/20/16   Time 4   Period Weeks   Status New     PT SHORT TERM GOAL #2   Title The patient will report a 25% reduction with usual ADLs   Time 4   Period Weeks   Status New     PT SHORT TERM GOAL #3   Title The patient will be able to activate transverse abdominals for core stabilization without doing a pelvic tilt or Valsalva maneuver   Time 4   Period Weeks   Status New           PT Long Term Goals - 02/21/16 2138      PT LONG TERM GOAL #1   Title The patient will be independent in safe self progression of HEP and/or return to yoga    04/17/16   Time 8   Period Weeks   Status New     PT LONG TERM GOAL #2   Title The patient will report a 50% improvement in daily pain and reduction of spasms   Time 8   Period Weeks   Status New     PT LONG TERM GOAL #3   Title The patient will demonstrate knowledge of proper body mechanics with ADLs for self management of chronic back pain and osteopenia   Time 8   Period Weeks   Status New     PT LONG TERM GOAL #4  Title The patient will have core and hip strength at least 4-/5 needed for household ADLs    Time 8   Period Weeks   Status New     PT LONG TERM GOAL #5   Title FOTO functional outcome score improved from 51% limitation to  43% indicating improved function with less pain   Time 8   Period Weeks   Status New               Plan - 02/21/16 02-Aug-2110    Clinical Impression Statement The patient has a very long history of LBP with complaints of a recent worsening of symptoms when she tried to join a yoga class.  She states just putting her mat on the floor and lying down caused her to have severe spasms and she layed on the mat "and cried for the hour."  She states just reaching will send her low back into spasms and this will happen about once a month.   She recently had an MRI which showed multi-level mild DDD and DJD.  She is eager to discuss these findings and an extensive period of time was spent discussing these changes and typical spinal changes with each decade of life.  No radiating symptoms.  Decreased lumbar AROM in all planes, patient complains of a worsening of symptoms with lumbar extension.  Decreased activation of transverse abdominals and lumbar multifidi 3/5.  Decreased gluteal strength 4-/5.  She would benefit from PT to address these deficits.  She is fearful of physical activity.   She is of moderate level complexity secondary to evolving condition and numerous co-morbidities including osteopenia, fibromyalgia and chronic fatigue syndrome.     Rehab Potential Good   Clinical Impairments Affecting Rehab Potential osteopenia;  fibromyalgia;  chronic fatigue syndrome;  has TENS unit at home; prone to severe spasms;  had major flare up with acupuncture in the past   PT Frequency 2x / week   PT Duration 8 weeks   PT Treatment/Interventions ADLs/Self Care Home Management;Electrical Stimulation;Ultrasound;Moist Heat;Traction;Patient/family education;Therapeutic exercise;Taping;Manual techniques   PT Next Visit Plan proceed very slowly with ex progression secondary to fibromyalgia and prone to flare ups;  neuroscience of pain;  abdominal brace progression and decompression series;  body mechanics training;   osteopenia education       Patient will benefit from skilled therapeutic intervention in order to improve the following deficits and impairments:  Decreased range of motion, Decreased strength, Increased muscle spasms, Pain  Visit Diagnosis: Chronic bilateral low back pain without sciatica - Plan: PT plan of care cert/re-cert  Muscle spasm of back - Plan: PT plan of care cert/re-cert  Muscle weakness (generalized) - Plan: PT plan of care cert/re-cert      G-Codes - [id number] 08/01/2140    Functional Assessment Tool Used FOTO ; clinical judgement    Functional Limitation Mobility: Walking and moving around   Mobility: Walking and Moving Around Current Status [phone]) At least 40 percent but less than 60 percent impaired, limited or restricted   Mobility: Walking and Moving Around Goal Status [phone]) At least 20 percent but less than 40 percent impaired, limited or restricted       Problem List There are no active problems to display for this patient.  Ruben Im, PT 02/21/16 9:47 PM Phone: [phone] Fax: [phone]  [name] 02/21/2016, 9:46 PM  [facility] [address], [place], [place], [address] Phone: [phone]   Fax:  [phone]  Name: CAPTOLA CARRAWAY MRN: [id number] Date of Birth: Feb 19, 1946

## 2016-02-27 DIAGNOSIS — L400 Psoriasis vulgaris: Secondary | ICD-10-CM | POA: Diagnosis not present

## 2016-02-29 ENCOUNTER — Ambulatory Visit: Payer: Medicare Other | Attending: Family Medicine

## 2016-02-29 DIAGNOSIS — M6281 Muscle weakness (generalized): Secondary | ICD-10-CM

## 2016-02-29 DIAGNOSIS — G8929 Other chronic pain: Secondary | ICD-10-CM | POA: Insufficient documentation

## 2016-02-29 DIAGNOSIS — M545 Low back pain, unspecified: Secondary | ICD-10-CM

## 2016-02-29 DIAGNOSIS — M62830 Muscle spasm of back: Secondary | ICD-10-CM

## 2016-02-29 NOTE — Patient Instructions (Addendum)
RE-ALIGNMENT ROUTINE EXERCISES-OSTEOPROROSIS BASIC FOR POSTURAL CORRECTION   RE-ALIGNMENT Tips BENEFITS: 1.It helps to re-align the curves of the back and improve standing posture. 2.It allows the back muscles to rest and strengthen in preparation for more activity. FREQUENCY: Daily, even after weeks, months and years of more advanced exercises. START: 1.All exercises start in the same position: lying on the back, arms resting on the supporting surface, palms up and slightly away from the body, backs of hands down, knees bent, feet flat. 2.The head, neck, arms, and legs are supported according to specific instructions of your therapist. Copyright  VHI. All rights reserved.    1. Decompression Exercise: Basic.   Takes compression off the vertebral bodies; increases tolerance for lying on the back; helps relieve back pain   Lie on back on firm surface, knees bent, feet flat, arms turned up, out to sides (~35 degrees). Head neck and arms supported as necessary. Time _5-15__ minutes. Surface: floor     2. Shoulder Press  Strengthens upper back extensors and scapular retractors.   Press both shoulders down. Hold _2-3__ seconds. Repeat _3-5__ times. Surface: floor        3. Head Press With [place]  Strengthens neck extensors   Tuck chin SLIGHTLY toward chest, keep mouth closed. Feel weight on back of head. Increase weight by pressing head down. Hold _2-3__ seconds. Relax. Repeat 3-5___ times. Surface: floor   4. Leg Lengthener: stretches quadratus lumborum and hip flexors.  Strengthens quads and ankle dorsiflexors.  Lower abdominal/core stability exercises  1. Practice your breathing technique: Inhale through your nose expanding your belly and rib cage. Try not to breathe into your chest. Exhale slowly and gradually out your mouth feeling a sense of softness to your body. Practice multiple times. This can be performed unlimited.  2. Finding the lower abdominals. Laying on  your back with the knees bent, place your fingers just below your belly button. Using your breathing technique from above, on your exhale gently pull the belly button away from your fingertips without tensing any other muscles. Practice this 5x. Next, as you exhale, draw belly button inwards and hold onto it...then feel as if you are pulling that muscle across your pelvis like you are tightening a belt. This can be hard to do at first so be patient and practice. Do 5-10 reps 1-3 x day. Always recognize quality over quantity; if your abdominal muscles become tired you will notice you may tighten/contract other muscles. This is the time to take a break.   Practice this first laying on your back, then in sitting, progressing to standing and finally adding it to all your daily movements.   [place] [address] [place], [place] [address] Phone # [phone] Fax [phone]

## 2016-02-29 NOTE — Therapy (Signed)
[facility] [address], [place], [place], [address] Phone: [phone]   Fax:  [phone]  Physical Therapy Treatment  Patient Details  Name: Sherry Thompson MRN: [id number] Date of Birth: 1945-08-16 Referring Provider: Dr. Justin Mend  Encounter Date: 02/29/2016      PT End of Session - 02/29/16 1312    Visit Number 2   Number of Visits 10   Date for PT Re-Evaluation 04/17/16   Authorization Type Medicare G codes;  KX at visit 15   PT Start Time 1234   PT Stop Time 1313   PT Time Calculation (min) 39 min   Activity Tolerance Patient tolerated treatment well   Behavior During Therapy [facility] for tasks assessed/performed      Past Medical History:  Diagnosis Date  . Chronic fatigue syndrome   . Depression   . Fibromyalgia   . Osteopenia   . Psoriasis     History reviewed. No pertinent surgical history.  There were no vitals filed for this visit.      Subjective Assessment - 02/29/16 1239    Subjective Pt reports that she feels achy due to weather today.     Pertinent History osteopenia;  fibromyalgia;  chronic fatigue syndrome   Currently in Pain? Yes   Pain Score 7    Pain Location Back   Pain Orientation Right;Left;Lower   Pain Descriptors / Indicators Aching;Sore   Pain Type Chronic pain   Pain Onset More than a month ago   Pain Frequency Constant   Aggravating Factors  sitting, lifting heavy things, arching my back, stress   Pain Relieving Factors medication, heat                         [facility] Adult PT Treatment/Exercise - 02/29/16 0001      Exercises   Exercises Neck;Lumbar     Lumbar Exercises: Aerobic   Stationary Bike NuStep: Level 1x 6 minutes     Lumbar Exercises: Supine   Ab Set 10 reps   Other Supine Lumbar Exercises decompression exercises -entire series with frequent demo and verbal cues.                  PT Education - 02/29/16 1253    Education  provided Yes   Education Details decompression and TA activation   Person(s) Educated Patient   Methods Explanation;Demonstration;Handout   Comprehension Verbalized understanding          PT Short Term Goals - 02/29/16 1243      PT SHORT TERM GOAL #1   Title The patient will demonstrate basic understanding of postural correction, osteopenia precautions and basic HEP  03/20/16   Time 4   Period Weeks   Status On-going     PT SHORT TERM GOAL #2   Title The patient will report a 25% reduction with usual ADLs   Time 4   Period Weeks   Status On-going     PT SHORT TERM GOAL #3   Title The patient will be able to activate transverse abdominals for core stabilization without doing a pelvic tilt or Valsalva maneuver   Time 4   Period Weeks   Status On-going           PT Long Term Goals - 02/21/16 2138      PT LONG TERM GOAL #1   Title The patient will be independent in safe self progression of HEP and/or return  to yoga    04/17/16   Time 8   Period Weeks   Status New     PT LONG TERM GOAL #2   Title The patient will report a 50% improvement in daily pain and reduction of spasms   Time 8   Period Weeks   Status New     PT LONG TERM GOAL #3   Title The patient will demonstrate knowledge of proper body mechanics with ADLs for self management of chronic back pain and osteopenia   Time 8   Period Weeks   Status New     PT LONG TERM GOAL #4   Title The patient will have core and hip strength at least 4-/5 needed for household ADLs    Time 8   Period Weeks   Status New     PT LONG TERM GOAL #5   Title FOTO functional outcome score improved from 51% limitation to 43% indicating improved function with less pain   Time 8   Period Weeks   Status New               Plan - 02/29/16 1243    Clinical Impression Statement Pt with only 1 session after evaluation.  Pt very cautious with movement today.  Pt able to tolerate NuStep and initiation of decompression  exercises today.  Extensive time and education with exercise instruction today. Pt with difficulty with abdominal bracing/TA activation today.  This improved throughout the session.  Pt will continue to benefit from skilled PT for core strength, decompression, and pain management.     Rehab Potential Good   Clinical Impairments Affecting Rehab Potential osteopenia;  fibromyalgia;  chronic fatigue syndrome;  has TENS unit at home; prone to severe spasms;  had major flare up with acupuncture in the past   PT Frequency 2x / week   PT Duration 8 weeks   PT Treatment/Interventions ADLs/Self Care Home Management;Electrical Stimulation;Ultrasound;Moist Heat;Traction;Patient/family education;Therapeutic exercise;Taping;Manual techniques   PT Next Visit Plan proceed very slowly with ex progression secondary to fibromyalgia and prone to flare ups;  neuroscience of pain; review abdominal brace progression and decompression series;  body mechanics training;  osteopenia education    Consulted and Agree with Plan of Care Patient      Patient will benefit from skilled therapeutic intervention in order to improve the following deficits and impairments:  Decreased range of motion, Decreased strength, Increased muscle spasms, Pain  Visit Diagnosis: Chronic bilateral low back pain without sciatica  Muscle spasm of back  Muscle weakness (generalized)     Problem List There are no active problems to display for this patient.    Sigurd Sos, PT 02/29/16 1:14 PM  [facility] [address], [place], [place], [address] Phone: [phone]   Fax:  [phone]  Name: CASSANDR HAURI MRN: [id number] Date of Birth: 12/17/1945

## 2016-03-02 DIAGNOSIS — L400 Psoriasis vulgaris: Secondary | ICD-10-CM | POA: Diagnosis not present

## 2016-03-05 DIAGNOSIS — L400 Psoriasis vulgaris: Secondary | ICD-10-CM | POA: Diagnosis not present

## 2016-03-06 ENCOUNTER — Ambulatory Visit: Payer: Medicare Other | Admitting: Physical Therapy

## 2016-03-06 ENCOUNTER — Encounter: Payer: Self-pay | Admitting: Physical Therapy

## 2016-03-06 DIAGNOSIS — M6281 Muscle weakness (generalized): Secondary | ICD-10-CM

## 2016-03-06 DIAGNOSIS — G8929 Other chronic pain: Secondary | ICD-10-CM

## 2016-03-06 DIAGNOSIS — M545 Low back pain, unspecified: Secondary | ICD-10-CM

## 2016-03-06 DIAGNOSIS — M62830 Muscle spasm of back: Secondary | ICD-10-CM

## 2016-03-06 NOTE — Therapy (Signed)
[facility] [address], [place], [place], [address] Phone: [phone]   Fax:  [phone]  Physical Therapy Treatment  Patient Details  Name: Sherry Thompson MRN: [id number] Date of Birth: Sep 03, 1945 Referring Provider: Dr. Justin Mend  Encounter Date: 03/06/2016      PT End of Session - 03/06/16 1503    Visit Number 3   Number of Visits 10   Date for PT Re-Evaluation 04/17/16   Authorization Type Medicare G codes;  KX at visit 15   PT Start Time 1458  Pt late   PT Stop Time 1536   PT Time Calculation (min) 38 min   Activity Tolerance Patient tolerated treatment well   Behavior During Therapy [facility] for tasks assessed/performed      Past Medical History:  Diagnosis Date  . Chronic fatigue syndrome   . Depression   . Fibromyalgia   . Osteopenia   . Psoriasis     History reviewed. No pertinent surgical history.  There were no vitals filed for this visit.      Subjective Assessment - 03/06/16 1501    Subjective Pt reports that she feels achy due to weather today.     Pertinent History osteopenia;  fibromyalgia;  chronic fatigue syndrome   Limitations Sitting;House hold activities;Lifting   How long can you sit comfortably? 1 hour   How long can you walk comfortably? 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 Yes   Pain Score 6    Pain Location Back   Pain Orientation Right;Left;Lower   Pain Descriptors / Indicators Aching;Sore   Pain Type Chronic pain   Pain Onset More than a month ago                         [facility] Adult PT Treatment/Exercise - 03/06/16 0001      Lumbar Exercises: Aerobic   Stationary Bike NuStep: Level 1x 6 minutes  Therapist present to discuss treatment     Lumbar Exercises: Supine   Ab Set 10 reps   TA contraction; verbal cueing for proper bracing.    Bent Knee Raise 10 reps   Other Supine Lumbar Exercises Diapharamic breathing     Lumbar Exercises: Quadruped   [place] --  Mid back stretch in sitting and leaning over chair                  PT Short Term Goals - 03/06/16 1504      PT SHORT TERM GOAL #1   Title The patient will demonstrate basic understanding of postural correction, osteopenia precautions and basic HEP  03/20/16   Time 4   Period Weeks   Status On-going     PT SHORT TERM GOAL #2   Title The patient will report a 25% reduction with usual ADLs   Time 4   Period Weeks   Status On-going     PT SHORT TERM GOAL #3   Title The patient will be able to activate transverse abdominals for core stabilization without doing a pelvic tilt or Valsalva maneuver   Time 4   Period Weeks   Status On-going           PT Long Term Goals - 03/06/16 1505  PT LONG TERM GOAL #1   Title The patient will be independent in safe self progression of HEP and/or return to yoga    04/17/16   Time 8   Period Weeks   Status On-going     PT LONG TERM GOAL #2   Title The patient will report a 50% improvement in daily pain and reduction of spasms   Time 8   Period Weeks   Status On-going     PT LONG TERM GOAL #3   Title The patient will demonstrate knowledge of proper body mechanics with ADLs for self management of chronic back pain and osteopenia   Time 8   Period Weeks   Status On-going     PT LONG TERM GOAL #4   Title The patient will have core and hip strength at least 4-/5 needed for household ADLs    Time 8   Period Weeks   Status On-going     PT LONG TERM GOAL #5   Title FOTO functional outcome score improved from 51% limitation to 43% indicating improved function with less pain   Time 8   Period Weeks   Status On-going               Plan - 03/06/16 1504    Clinical Impression Statement Pt feels that she has too much arch in her  mid back. Hip flexion and rounded back is comfortable while extension increases pain. Pt educated in breathing technique, diapharamic breathing, and stretching.  Pt has difficulty rounding mid back and is fearful of flexion due to herniated disks. Pt able to tolerate all exercises well today. Will continue with slow progression on abdominal strengthening and back flexiility.    Rehab Potential Good   Clinical Impairments Affecting Rehab Potential osteopenia;  fibromyalgia;  chronic fatigue syndrome;  has TENS unit at home; prone to severe spasms;  had major flare up with acupuncture in the past   PT Frequency 2x / week   PT Duration 8 weeks   PT Treatment/Interventions ADLs/Self Care Home Management;Electrical Stimulation;Ultrasound;Moist Heat;Traction;Patient/family education;Therapeutic exercise;Taping;Manual techniques   PT Next Visit Plan proceed very slowly with ex progression secondary to fibromyalgia and prone to flare ups;  neuroscience of pain; review abdominal brace progression and decompression series;  body mechanics training;  osteopenia education    Consulted and Agree with Plan of Care Patient      Patient will benefit from skilled therapeutic intervention in order to improve the following deficits and impairments:  Decreased range of motion, Decreased strength, Increased muscle spasms, Pain  Visit Diagnosis: Chronic bilateral low back pain without sciatica  Muscle spasm of back  Muscle weakness (generalized)     Problem List There are no active problems to display for this patient.   Mikle Bosworth PTA 03/06/2016, 3:55 PM  [facility] [address] [place], [place], [address] Phone: [phone]   Fax:  [phone]  Name: Sherry Thompson MRN: [id number] Date of Birth: Nov 10, 1945

## 2016-03-07 DIAGNOSIS — L400 Psoriasis vulgaris: Secondary | ICD-10-CM | POA: Diagnosis not present

## 2016-03-08 ENCOUNTER — Encounter: Payer: Self-pay | Admitting: Physical Therapy

## 2016-03-08 ENCOUNTER — Ambulatory Visit: Payer: Medicare Other | Admitting: Physical Therapy

## 2016-03-08 DIAGNOSIS — M62830 Muscle spasm of back: Secondary | ICD-10-CM

## 2016-03-08 DIAGNOSIS — M545 Low back pain: Principal | ICD-10-CM

## 2016-03-08 DIAGNOSIS — G8929 Other chronic pain: Secondary | ICD-10-CM

## 2016-03-08 DIAGNOSIS — M6281 Muscle weakness (generalized): Secondary | ICD-10-CM

## 2016-03-08 NOTE — Therapy (Addendum)
[facility] [address] [place], [place], [address] Phone: [phone]   Fax:  [phone]  Physical Therapy Treatment  Patient Details  Name: Sherry Thompson MRN: [id number] Date of Birth: 1945-09-12 Referring Provider: Dr. Justin Mend  Encounter Date: 03/08/2016      PT End of Session - 03/08/16 1524    Visit Number 4   Number of Visits 10   Date for PT Re-Evaluation 04/17/16   Authorization Type Medicare G codes;  KX at visit 15   PT Start Time 1445   PT Stop Time 1524   PT Time Calculation (min) 39 min   Activity Tolerance Patient tolerated treatment well   Behavior During Therapy [facility] for tasks assessed/performed      Past Medical History:  Diagnosis Date  . Chronic fatigue syndrome   . Depression   . Fibromyalgia   . Osteopenia   . Psoriasis     History reviewed. No pertinent surgical history.  There were no vitals filed for this visit.      Subjective Assessment - 03/08/16 1448    Subjective Pt reports feeling some twinges in low back with certain movements   Pertinent History osteopenia;  fibromyalgia;  chronic fatigue syndrome   Limitations Sitting;House hold activities;Lifting   How long can you sit comfortably? 1 hour   How long can you walk comfortably? I walk the dog every day without problem    Diagnostic tests recent MRI multi level DJD and DJD with L4-5 anterolisthesis   Patient Stated Goals techniques when I get into trouble;  HEP   Pain Score 6    Pain Location Back   Pain Orientation Right;Left;Lower   Pain Descriptors / Indicators Aching;Sore   Pain Type Chronic pain   Pain Radiating Towards none   Pain Onset More than a month ago   Pain Frequency Constant   Aggravating Factors  sitting, lifting heavey things, arching my back, stress   Pain Relieving Factors medication, heat                         [facility] Adult PT Treatment/Exercise - 03/08/16 0001      Lumbar  Exercises: Aerobic   Stationary Bike NuStep: Level 1x 6 minutes  Therapist present to discuss treatment     Lumbar Exercises: Supine   Ab Set 10 reps  TA contraction; VC for proper bracing. hips/knees 90/90   Bent Knee Raise 20 reps   Large Ball Abdominal Isometric 10 reps  lateral rotation with red ball   Other Supine Lumbar Exercises Diapharamic breathing     Manual soft tissue mobilization to Bil lumbar paraspinals with pressure to patient tolerance to decrease tension in muscles and decrease pain. Mikle Bosworth, PTA 03/13/16 2:11 PM             PT Short Term Goals - 03/06/16 1504      PT SHORT TERM GOAL #1   Title The patient will demonstrate basic understanding of postural correction, osteopenia precautions and basic HEP  03/20/16   Time 4   Period Weeks   Status On-going     PT SHORT TERM GOAL #2   Title The patient will report a 25% reduction with usual ADLs   Time 4   Period Weeks   Status On-going     PT SHORT TERM GOAL #3   Title The patient will be able to activate transverse abdominals for core stabilization without  doing a pelvic tilt or Valsalva maneuver   Time 4   Period Weeks   Status On-going           PT Long Term Goals - 03/06/16 1505      PT LONG TERM GOAL #1   Title The patient will be independent in safe self progression of HEP and/or return to yoga    04/17/16   Time 8   Period Weeks   Status On-going     PT LONG TERM GOAL #2   Title The patient will report a 50% improvement in daily pain and reduction of spasms   Time 8   Period Weeks   Status On-going     PT LONG TERM GOAL #3   Title The patient will demonstrate knowledge of proper body mechanics with ADLs for self management of chronic back pain and osteopenia   Time 8   Period Weeks   Status On-going     PT LONG TERM GOAL #4   Title The patient will have core and hip strength at least 4-/5 needed for household ADLs    Time 8   Period Weeks   Status On-going      PT LONG TERM GOAL #5   Title FOTO functional outcome score improved from 51% limitation to 43% indicating improved function with less pain   Time 8   Period Weeks   Status On-going               Plan - 03/08/16 1529    Clinical Impression Statement Pt reports back feeling about the same. Able to tolerate all strengthening exercises well. Responded well to manual therapy. Pt will conitue to benfit from skilled therapy for core strength and stability.    Rehab Potential Good   Clinical Impairments Affecting Rehab Potential osteopenia;  fibromyalgia;  chronic fatigue syndrome;  has TENS unit at home; prone to severe spasms;  had major flare up with acupuncture in the past   PT Frequency 2x / week   PT Duration 8 weeks   PT Treatment/Interventions ADLs/Self Care Home Management;Electrical Stimulation;Ultrasound;Moist Heat;Traction;Patient/family education;Therapeutic exercise;Taping;Manual techniques   PT Next Visit Plan proceed very slowly with ex progression secondary to fibromyalgia and prone to flare ups;  neuroscience of pain; review abdominal brace progression and decompression series;  body mechanics training;  osteopenia education    Consulted and Agree with Plan of Care Patient      Patient will benefit from skilled therapeutic intervention in order to improve the following deficits and impairments:  Decreased range of motion, Decreased strength, Increased muscle spasms, Pain  Visit Diagnosis: Chronic bilateral low back pain without sciatica  Muscle spasm of back  Muscle weakness (generalized)     Problem List There are no active problems to display for this patient.   Mikle Bosworth PTA 03/08/2016, 4:23 PM  [facility] [address] [place], [place], [address] Phone: [phone]   Fax:  [phone]  Name: Sherry Thompson MRN: [id number] Date of Birth: Jan 22, 1946

## 2016-03-12 DIAGNOSIS — L400 Psoriasis vulgaris: Secondary | ICD-10-CM | POA: Diagnosis not present

## 2016-03-13 ENCOUNTER — Encounter: Payer: Self-pay | Admitting: Physical Therapy

## 2016-03-13 ENCOUNTER — Ambulatory Visit: Payer: Medicare Other | Admitting: Physical Therapy

## 2016-03-13 DIAGNOSIS — M62830 Muscle spasm of back: Secondary | ICD-10-CM

## 2016-03-13 DIAGNOSIS — G8929 Other chronic pain: Secondary | ICD-10-CM

## 2016-03-13 DIAGNOSIS — M545 Low back pain: Principal | ICD-10-CM

## 2016-03-13 DIAGNOSIS — M6281 Muscle weakness (generalized): Secondary | ICD-10-CM | POA: Diagnosis not present

## 2016-03-13 NOTE — Therapy (Signed)
[facility] [address], [place], [place], [address] Phone: [phone]   Fax:  [phone]  Physical Therapy Treatment  Patient Details  Name: Sherry Thompson MRN: [id number] Date of Birth: February 01, 1946 Referring Provider: Dr. Justin Mend  Encounter Date: 03/13/2016      PT End of Session - 03/13/16 1503    Visit Number 5   Number of Visits 10   Date for PT Re-Evaluation 04/17/16   Authorization Type Medicare G codes;  KX at visit 15   PT Start Time 1455   PT Stop Time 1543   PT Time Calculation (min) 48 min   Activity Tolerance Patient limited by pain   Behavior During Therapy [facility] for tasks assessed/performed      Past Medical History:  Diagnosis Date  . Chronic fatigue syndrome   . Depression   . Fibromyalgia   . Osteopenia   . Psoriasis     History reviewed. No pertinent surgical history.  There were no vitals filed for this visit.      Subjective Assessment - 03/13/16 1458    Subjective Pt reports falling yesterday trying to get decorations out of the closet. Also aggrivated back when leaning over table to put a heavy ceramic object on table.    Pertinent History osteopenia;  fibromyalgia;  chronic fatigue syndrome   Limitations Sitting;House hold activities;Lifting   How long can you sit comfortably? 1 hour   How long can you walk comfortably? 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 Yes   Pain Score 9    Pain Location Back   Pain Orientation Right;Left;Lower   Pain Descriptors / Indicators Aching;Sore   Pain Type Chronic pain   Pain Onset More than a month ago   Pain Frequency Constant   Aggravating Factors  sitting, lifting heavy things, arching my back, stress   Pain Relieving Factors medication, heat                          [facility] Adult PT Treatment/Exercise - 03/13/16 0001      Lumbar Exercises: Stretches   Active Hamstring Stretch 2 reps;10 seconds   Single Knee to Chest Stretch 2 reps;10 seconds   ITB Stretch 2 reps;10 seconds     Modalities   Modalities Moist Heat;Electrical Stimulation     Moist Heat Therapy   Number Minutes Moist Heat 15 Minutes   Moist Heat Location Lumbar Spine     Electrical Stimulation   Electrical Stimulation Location Bil parspinals   15 mins mid-low back; 6 minutes cervical-mid back   Electrical Stimulation Action IFC   Electrical Stimulation Parameters To tolerance  Pt prone with pillow under hips   Electrical Stimulation Goals Pain                  PT Short Term Goals - 03/13/16 1506      PT SHORT TERM GOAL #1   Title The patient will demonstrate basic understanding of postural correction, osteopenia precautions and basic HEP  03/20/16   Time 4   Period Weeks   Status On-going     PT SHORT TERM GOAL #2   Title The patient will report a 25% reduction with usual ADLs   Time 4  Period Weeks   Status On-going     PT SHORT TERM GOAL #3   Title The patient will be able to activate transverse abdominals for core stabilization without doing a pelvic tilt or Valsalva maneuver   Time 4   Period Weeks   Status On-going           PT Long Term Goals - 03/13/16 1505      PT LONG TERM GOAL #1   Title The patient will be independent in safe self progression of HEP and/or return to yoga    04/17/16   Time 8   Period Weeks   Status On-going     PT LONG TERM GOAL #2   Title The patient will report a 50% improvement in daily pain and reduction of spasms   Time 8   Period Weeks   Status On-going     PT LONG TERM GOAL #3   Title The patient will demonstrate knowledge of proper body mechanics with ADLs for self management of chronic back pain and osteopenia   Time 8   Period Weeks   Status On-going     PT LONG  TERM GOAL #4   Title The patient will have core and hip strength at least 4-/5 needed for household ADLs    Time 8   Period Weeks   Status On-going     PT LONG TERM GOAL #5   Title FOTO functional outcome score improved from 51% limitation to 43% indicating improved function with less pain   Time 8   Period Weeks   Status On-going               Plan - 03/13/16 1516    Clinical Impression Statement Pt sustained fall last night landing on back. Pt reports 9/10 pain today. Had increased pain after last session possibly due to rotation exercsies and possibly due to manual therapy. Pt began to have some increased pain in low back after stretches. Pt will continue to benefit from skilled therapy for core strength and stability and managment of pain symptoms.   Rehab Potential Good   Clinical Impairments Affecting Rehab Potential osteopenia;  fibromyalgia;  chronic fatigue syndrome;  has TENS unit at home; prone to severe spasms;  had major flare up with acupuncture in the past   PT Frequency 2x / week   PT Duration 8 weeks   PT Treatment/Interventions ADLs/Self Care Home Management;Electrical Stimulation;Ultrasound;Moist Heat;Traction;Patient/family education;Therapeutic exercise;Taping;Manual techniques   PT Next Visit Plan proceed very slowly with ex progression secondary to fibromyalgia and prone to flare ups;  neuroscience of pain; review abdominal brace progression and decompression series;  body mechanics training;  osteopenia education    Consulted and Agree with Plan of Care Patient      Patient will benefit from skilled therapeutic intervention in order to improve the following deficits and impairments:  Decreased range of motion, Decreased strength, Increased muscle spasms, Pain  Visit Diagnosis: Chronic bilateral low back pain without sciatica  Muscle spasm of back  Muscle weakness (generalized)     Problem List There are no active problems to display for this  patient.   Mikle Bosworth PTA 03/13/2016, 3:53 PM  [facility] [address], [place], [place], [address] Phone: [phone]   Fax:  [phone]  Name: Sherry Thompson MRN: [id number] Date of Birth: April 11, 1946

## 2016-03-14 DIAGNOSIS — L400 Psoriasis vulgaris: Secondary | ICD-10-CM | POA: Diagnosis not present

## 2016-03-19 DIAGNOSIS — L400 Psoriasis vulgaris: Secondary | ICD-10-CM | POA: Diagnosis not present

## 2016-03-20 ENCOUNTER — Ambulatory Visit: Payer: Medicare Other | Admitting: Physical Therapy

## 2016-03-20 ENCOUNTER — Encounter: Payer: Self-pay | Admitting: Physical Therapy

## 2016-03-20 DIAGNOSIS — M545 Low back pain, unspecified: Secondary | ICD-10-CM

## 2016-03-20 DIAGNOSIS — G8929 Other chronic pain: Secondary | ICD-10-CM | POA: Diagnosis not present

## 2016-03-20 DIAGNOSIS — M62830 Muscle spasm of back: Secondary | ICD-10-CM | POA: Diagnosis not present

## 2016-03-20 DIAGNOSIS — M6281 Muscle weakness (generalized): Secondary | ICD-10-CM

## 2016-03-20 NOTE — Patient Instructions (Signed)
Bridge    Lie back, legs bent. Inhale, pressing hips up. Keeping ribs in, lengthen lower back. Exhale, rolling down along spine from top. Repeat __10__ times. Do __1__ sessions per day.  http://pm.exer.us/55   Copyright  VHI. All rights reserved.  Hip Flexion / Knee Extension: Straight-Leg Raise (Eccentric)   Lie on back. Lift leg with knee straight. Slowly lower leg for 3-5 seconds. _10__ reps per set, _2__ sets per day, __3_ days per week. Lower like elevator, stopping at each floor. Rest on straight arms.  ABDUCTION: Side-Lying (Active)   Lie on left side, top leg straight. Raise top leg as far as possible. . Complete _2__ sets of __10_ repetitions. Perform _1__ sessions per day.  http://gtsc.exer.us/94    ADDUCTION: Side-Lying (Active)   Lie on right side, with top leg bent and in front of other leg. Lift straight leg up as high as possible. . Complete _2__ sets of _10__ repetitions. Perform __1_ sessions per day.  http://gtsc.exer.us/129   Copyright  VHI. All rights reserved.  Adduction: Hip - Knees Together (Hook-Lying)    Lie with hips and knees bent, towel roll between knees. Push knees together. Hold for _2__ seconds. Rest for __2_ seconds. Repeat __10_ times. Do __1_ times a day.   Copyright  VHI. All rights reserved.   Mikle Bosworth, PTA 03/20/16 3:32 PM  [facility] [address], [place], [place] [address] Phone # [phone] Fax [phone]

## 2016-03-20 NOTE — Therapy (Addendum)
[facility] [address], [place], [place], [address] Phone: [phone]   Fax:  [phone]  Physical Therapy Treatment  Patient Details  Name: Sherry Thompson MRN: [id number] Date of Birth: Jun 29, 1945 Referring Provider: Dr. Justin Mend  Encounter Date: 03/20/2016      PT End of Session - 03/20/16 1552    Visit Number 6   Number of Visits 10   Date for PT Re-Evaluation 04/17/16   Authorization Type Medicare G codes;  KX at visit 15   PT Start Time 1454   PT Stop Time 1533   PT Time Calculation (min) 39 min   Behavior During Therapy [facility] for tasks assessed/performed      Past Medical History:  Diagnosis Date  . Chronic fatigue syndrome   . Depression   . Fibromyalgia   . Osteopenia   . Psoriasis     History reviewed. No pertinent surgical history.  There were no vitals filed for this visit.      Subjective Assessment - 03/20/16 1508    Subjective Pt reports having increase pain today after lifting trashcan yesterday. Wanting to do exercises that she is able to do at home.    Pertinent History osteopenia;  fibromyalgia;  chronic fatigue syndrome   Limitations Sitting;House hold activities;Lifting   How long can you sit comfortably? 1 hour   How long can you walk comfortably? 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 Yes   Pain Score 8    Pain Location Back   Pain Orientation Right;Left;Lower   Pain Descriptors / Indicators Aching;Sore   Pain Type Chronic pain   Pain Frequency Constant   Aggravating Factors  sitting, lifting heavy things, arching my back                         [facility] Adult PT Treatment/Exercise - 03/20/16 0001      Lumbar Exercises: Aerobic   Stationary Bike NuStep: Level 1x 6 minutes  Therapist present  to discuss treatment     Lumbar Exercises: Supine   Bent Knee Raise 20 reps   Straight Leg Raise 10 reps   Other Supine Lumbar Exercises Supine Marching   Other Supine Lumbar Exercises Ball squeeze with abdominal brace     Lumbar Exercises: Sidelying   Hip Abduction 10 reps   Other Sidelying Lumbar Exercises Hip adduction 10 reps     Electric stimulation to Bil lumbar paraspinals pt supine IFC to tolerance for pain concurrent with moist heat pack to lumbar spine.   Mikle Bosworth, PTA 03/20/16 5:15 PM          PT Education - 03/20/16 1532    Education provided Yes   Education Details core stability   Person(s) Educated Patient   Methods Demonstration;Handout;Explanation   Comprehension Verbalized understanding          PT Short Term Goals - 03/13/16 1506      PT SHORT TERM GOAL #1   Title The patient will demonstrate basic understanding of postural correction, osteopenia precautions and basic HEP  03/20/16   Time 4   Period Weeks   Status On-going     PT SHORT TERM GOAL #2   Title The patient will report a 25% reduction with usual  ADLs   Time 4   Period Weeks   Status On-going     PT SHORT TERM GOAL #3   Title The patient will be able to activate transverse abdominals for core stabilization without doing a pelvic tilt or Valsalva maneuver   Time 4   Period Weeks   Status On-going           PT Long Term Goals - 03/13/16 1505      PT LONG TERM GOAL #1   Title The patient will be independent in safe self progression of HEP and/or return to yoga    04/17/16   Time 8   Period Weeks   Status On-going     PT LONG TERM GOAL #2   Title The patient will report a 50% improvement in daily pain and reduction of spasms   Time 8   Period Weeks   Status On-going     PT LONG TERM GOAL #3   Title The patient will demonstrate knowledge of proper body mechanics with ADLs for self management of chronic back pain and osteopenia   Time 8   Period Weeks    Status On-going     PT LONG TERM GOAL #4   Title The patient will have core and hip strength at least 4-/5 needed for household ADLs    Time 8   Period Weeks   Status On-going     PT LONG TERM GOAL #5   Title FOTO functional outcome score improved from 51% limitation to 43% indicating improved function with less pain   Time 8   Period Weeks   Status On-going               Plan - 03/20/16 1536    Clinical Impression Statement Pt having increase back pain today after picking up trashcan. Pt able to tolerate all core exercises with no reported increase in pain today. Pt is prgressing very slowly due to increase in pain with most therapy interventions. Pt educated in proper body mechanics and practiced squat form. Pt able to tolerate all exercises well today. Pt will continue to benefit from skilled therapy for core strengthening as tolerated.    Rehab Potential Good   Clinical Impairments Affecting Rehab Potential osteopenia;  fibromyalgia;  chronic fatigue syndrome;  has TENS unit at home; prone to severe spasms;  had major flare up with acupuncture in the past   PT Frequency 2x / week   PT Duration 8 weeks   PT Treatment/Interventions ADLs/Self Care Home Management;Electrical Stimulation;Ultrasound;Moist Heat;Traction;Patient/family education;Therapeutic exercise;Taping;Manual techniques   PT Next Visit Plan proceed very slowly with ex progression secondary to fibromyalgia and prone to flare ups;  neuroscience of pain; review abdominal brace progression and decompression series;  body mechanics training;  osteopenia education    Consulted and Agree with Plan of Care Patient      Patient will benefit from skilled therapeutic intervention in order to improve the following deficits and impairments:  Decreased range of motion, Decreased strength, Increased muscle spasms, Pain  Visit Diagnosis: Chronic bilateral low back pain without sciatica  Muscle spasm of back  Muscle weakness  (generalized)     Problem List There are no active problems to display for this patient.   Mikle Bosworth PTA 03/20/2016, 4:10 PM  [facility] [address] [place], [place], [address] Phone: [phone]   Fax:  [phone]  Name: VELICIA WALKER MRN: [id number] Date of Birth: 16-Mar-1946

## 2016-03-21 DIAGNOSIS — L400 Psoriasis vulgaris: Secondary | ICD-10-CM | POA: Diagnosis not present

## 2016-03-22 ENCOUNTER — Ambulatory Visit: Payer: Medicare Other | Admitting: Physical Therapy

## 2016-03-22 DIAGNOSIS — F339 Major depressive disorder, recurrent, unspecified: Secondary | ICD-10-CM | POA: Diagnosis not present

## 2016-03-22 DIAGNOSIS — G8929 Other chronic pain: Secondary | ICD-10-CM | POA: Diagnosis not present

## 2016-03-22 DIAGNOSIS — M62830 Muscle spasm of back: Secondary | ICD-10-CM

## 2016-03-22 DIAGNOSIS — M6281 Muscle weakness (generalized): Secondary | ICD-10-CM

## 2016-03-22 DIAGNOSIS — M545 Low back pain, unspecified: Secondary | ICD-10-CM

## 2016-03-22 NOTE — Therapy (Signed)
Sherry Thompson [address], [place], [place], [address] Phone: [phone]   Fax:  [phone]  Physical Therapy Treatment  Patient Details  Name: Sherry Thompson MRN: [id number] Date of Birth: [birth year] Referring Provider: Dr. Justin Thompson  Encounter Date: 03/22/2016      PT End of Session - 03/22/16 1833    Visit Number 7   Number of Visits 10   Date for PT Re-Evaluation 04/17/16   Authorization Type Medicare G codes;  KX at visit 15   PT Start Time 1453   PT Stop Time 1540   PT Time Calculation (min) 47 min   Activity Tolerance Patient limited by pain      Past Medical History:  Diagnosis Date  . Chronic fatigue syndrome   . Depression   . Fibromyalgia   . Osteopenia   . Psoriasis     No past surgical history on file.  There were no vitals filed for this visit.      Subjective Assessment - 03/22/16 1453    Subjective "I think the fibromyalgia component makes this much more complicated."  My pain is not any better.  I have to stand in the light box for psoriasis.  I go to counseling every week.  Today is a bad day.  Not too bad with supine ex.  I did take a tumble since I was here from a footstool reaching into cabinet.  Right side ribs are still sore.      Currently in Pain? Yes   Pain Score 9    Pain Location Back   Pain Orientation Lower                         [facility] Adult PT Treatment/Exercise - 03/22/16 0001      Self-Care   Self-Care ADL's;Other Self-Care Comments   ADL's discussion of activity modification for home chores (patient lives alone and is responsible for all home chores)   Other Self-Care Comments  extensive discussion on chronic pain, neuroscience of pain, fibromyalgia and chronic fatigue syndrome and implications     Lumbar Exercises: Supine   Ab Set 5 reps   Isometric Hip Flexion 5 reps   Isometric Hip Flexion Limitations feet resting on bolster as starting point    Other Supine Lumbar Exercises dead bug with feet starting on bolster 5x   Other Supine Lumbar Exercises Ball squeeze with abdominal brace     Moist Heat Therapy   Number Minutes Moist Heat 15 Minutes  1/2 of time with exercises for pain control   Moist Heat Location Lumbar Spine     Electrical Stimulation   Electrical Stimulation Location bilateral lumbar   Electrical Stimulation Action IFC 1/2 of time with exercises for pain control   Electrical Stimulation Parameters 13 ma 15 min    Electrical Stimulation Goals Pain                  PT Short Term Goals - 03/22/16 1847      PT SHORT TERM GOAL #1   Title The patient will demonstrate basic understanding of postural correction, osteopenia precautions and basic HEP  03/20/16   Status Achieved     PT SHORT TERM GOAL #2   Title The patient will report a 25% reduction in pain with usual ADLs   Time 4   Period Weeks   Status On-going     PT SHORT TERM GOAL #3  Title The patient will be able to activate transverse abdominals for core stabilization without doing a pelvic tilt or Valsalva maneuver   Status Achieved           PT Long Term Goals - 03/22/16 1848      PT LONG TERM GOAL #1   Title The patient will be independent in safe self progression of HEP and/or return to yoga    04/17/16   Time 8   Period Weeks   Status On-going     PT LONG TERM GOAL #2   Title The patient will report a 50% improvement in daily pain and reduction of spasms   Time 8   Period Weeks   Status On-going     PT LONG TERM GOAL #3   Title The patient will demonstrate knowledge of proper body mechanics with ADLs for self management of chronic back pain and osteopenia   Time 8   Period Weeks   Status On-going     PT LONG TERM GOAL #4   Title The patient will have core and hip strength at least 4-/5 needed for household ADLs    Time 8   Period Weeks   Status On-going     PT LONG TERM GOAL #5   Title FOTO functional outcome  score improved from 51% limitation to 43% indicating improved function with less pain   Time 8   Period Weeks   Status On-going               Plan - 03/22/16 1835    Clinical Impression Statement The patient expresses frustration that her back pain is no better.    After years of chronic pain with spinal degenerative changes and fibromyalgia and  numerous severe flare ups, it is not surprising that [name] reports no change in her back pain intensity.  Extensive education today on the neuroscience of pain, using her 5 senses to calm brain sensitization, modification of home chores into small blocks, setting mini-goals for the short term in order to accomplish loftier long term goals and the importance of low intensity exercise for improvements over the long term.  She is able to participate in very low level transverse abdominal activation exercises with small UE and LE movements and with concurrent e-stim/heat without exacerbation of pain or pain behaviors.  Therapist closely monitoring response with all treatment interventions.     PT Next Visit Plan proceed very slowly with ex progression secondary to fibromyalgia and prone to flare ups;  neuroscience of pain education;  abdominal brace progression with small movements of UE/LE in supine and progress to sitting and standing as tolerated;  e-stim/heat concurrently or after for pain control      Patient will benefit from skilled therapeutic intervention in order to improve the following deficits and impairments:     Visit Diagnosis: Chronic bilateral low back pain without sciatica  Muscle spasm of back  Muscle weakness (generalized)     Problem List There are no active problems to display for this patient.  Sherry Thompson, PT 03/22/16 6:51 PM Phone: [phone] Fax: [phone]  Sherry Thompson 03/22/2016, 6:50 PM  [facility] [address] [place], [place], [address] Phone: [phone]   Fax:  [phone]  Name: Sherry Thompson MRN: [id number] Date of Birth: [birth year]

## 2016-03-26 DIAGNOSIS — L400 Psoriasis vulgaris: Secondary | ICD-10-CM | POA: Diagnosis not present

## 2016-03-27 ENCOUNTER — Encounter: Payer: Self-pay | Admitting: Physical Therapy

## 2016-03-27 ENCOUNTER — Ambulatory Visit: Payer: Medicare Other | Attending: Family Medicine | Admitting: Physical Therapy

## 2016-03-27 DIAGNOSIS — M545 Low back pain, unspecified: Secondary | ICD-10-CM

## 2016-03-27 DIAGNOSIS — G8929 Other chronic pain: Secondary | ICD-10-CM | POA: Insufficient documentation

## 2016-03-27 DIAGNOSIS — M62830 Muscle spasm of back: Secondary | ICD-10-CM | POA: Diagnosis not present

## 2016-03-27 DIAGNOSIS — M6281 Muscle weakness (generalized): Secondary | ICD-10-CM | POA: Diagnosis not present

## 2016-03-27 NOTE — Therapy (Signed)
[facility] [address] [place], [place], [address] Phone: [phone]   Fax:  [phone]  Physical Therapy Treatment  Patient Details  Name: Sherry Thompson MRN: [id number] Date of Birth: [birth year] Referring Provider: Dr. Justin Mend  Encounter Date: 03/27/2016      PT End of Session - 03/27/16 1507    Visit Number 8   Number of Visits 10   Date for PT Re-Evaluation 04/17/16   Authorization Type Medicare G codes;  KX at visit 15   PT Start Time 1457   PT Stop Time 1551   PT Time Calculation (min) 54 min   Activity Tolerance Patient limited by pain   Behavior During Therapy [facility] for tasks assessed/performed      Past Medical History:  Diagnosis Date  . Chronic fatigue syndrome   . Depression   . Fibromyalgia   . Osteopenia   . Psoriasis     History reviewed. No pertinent surgical history.  There were no vitals filed for this visit.      Subjective Assessment - 03/27/16 1503    Subjective Pt reports having an episode in middle of the night when drawing her knees up whole back tightening up.   Pertinent History osteopenia;  fibromyalgia;  chronic fatigue syndrome   Limitations Sitting;House hold activities;Lifting   How long can you sit comfortably? 1 hour   How long can you walk comfortably? 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 Yes   Pain Score 9    Pain Location Back   Pain Orientation Lower   Pain Descriptors / Indicators Aching;Sore   Pain Type Chronic pain   Pain Frequency Constant                         [facility] Adult PT Treatment/Exercise - 03/27/16 0001      Lumbar Exercises: Stretches   Single Knee to Chest Stretch 2 reps;10 seconds   Double Knee to Chest Stretch 2 reps;10 seconds     Lumbar Exercises:  Aerobic   Stationary Bike NuStep: Level 1x 10 minutes  Therapist present to discuss treatment     Lumbar Exercises: Supine   Bent Knee Raise 20 reps   Other Supine Lumbar Exercises Diapharamic breathing     Moist Heat Therapy   Number Minutes Moist Heat 15 Minutes   Moist Heat Location Lumbar Spine     Electrical Stimulation   Electrical Stimulation Location bilateral lumbar   Electrical Stimulation Action IFC   Electrical Stimulation Parameters To tolerance   Electrical Stimulation Goals Pain     Manual Therapy   Manual Therapy Taping   Manual therapy comments to Rt rib for bruising and swelling                  PT Short Term Goals - 03/27/16 1511      PT SHORT TERM GOAL #1   Title The patient will demonstrate basic understanding of postural correction, osteopenia precautions and basic HEP  03/20/16   Time 4   Period Weeks   Status Achieved     PT SHORT TERM GOAL #2   Title The patient will report a 25% reduction in pain with usual ADLs   Time 4   Period  Weeks   Status On-going     PT SHORT TERM GOAL #3   Title The patient will be able to activate transverse abdominals for core stabilization without doing a pelvic tilt or Valsalva maneuver   Time 4   Period Weeks   Status Achieved           PT Long Term Goals - 03/27/16 1512      PT LONG TERM GOAL #1   Title The patient will be independent in safe self progression of HEP and/or return to yoga    04/17/16   Time 8   Period Weeks   Status On-going     PT LONG TERM GOAL #2   Title The patient will report a 50% improvement in daily pain and reduction of spasms   Time 8   Period Weeks   Status On-going     PT LONG TERM GOAL #3   Title The patient will demonstrate knowledge of proper body mechanics with ADLs for self management of chronic back pain and osteopenia   Time 8   Period Weeks   Status On-going     PT LONG TERM GOAL #4   Title The patient will have core and hip strength at least  4-/5 needed for household ADLs    Time 8   Period Weeks   Status On-going     PT LONG TERM GOAL #5   Title FOTO functional outcome score improved from 51% limitation to 43% indicating improved function with less pain   Time 8   Period Weeks   Status On-going               Plan - 03/27/16 1539    Clinical Impression Statement Pt presents with pain in back especially on Rt side where pt sustained fall several days ago causing mild bruising. Pt continues to be limited by pain with exercises. Tape applied to Rt side over pain to create space for bruising. Pt will conintue to benefit from silled therapy for core strengthening and flexibility.    Rehab Potential Good   Clinical Impairments Affecting Rehab Potential osteopenia;  fibromyalgia;  chronic fatigue syndrome;  has TENS unit at home; prone to severe spasms;  had major flare up with acupuncture in the past   PT Frequency 2x / week   PT Duration 8 weeks   PT Treatment/Interventions ADLs/Self Care Home Management;Electrical Stimulation;Ultrasound;Moist Heat;Traction;Patient/family education;Therapeutic exercise;Taping;Manual techniques   PT Next Visit Plan proceed very slowly with ex progression secondary to fibromyalgia and prone to flare ups;  neuroscience of pain education;  abdominal brace progression with small movements of UE/LE in supine and progress to sitting and standing as tolerated;  e-stim/heat concurrently or after for pain control   Consulted and Agree with Plan of Care Patient      Patient will benefit from skilled therapeutic intervention in order to improve the following deficits and impairments:  Decreased range of motion, Decreased strength, Increased muscle spasms, Pain  Visit Diagnosis: Chronic bilateral low back pain without sciatica  Muscle spasm of back  Muscle weakness (generalized)     Problem List There are no active problems to display for this patient.   Mikle Bosworth PTA 03/27/2016,  3:57 PM  [facility] [address] [place], [place], [address] Phone: [phone]   Fax:  [phone]  Name: Sherry Thompson MRN: [id number] Date of Birth: 1946/02/24

## 2016-03-28 DIAGNOSIS — F339 Major depressive disorder, recurrent, unspecified: Secondary | ICD-10-CM | POA: Diagnosis not present

## 2016-03-28 DIAGNOSIS — L400 Psoriasis vulgaris: Secondary | ICD-10-CM | POA: Diagnosis not present

## 2016-03-29 ENCOUNTER — Encounter: Payer: Self-pay | Admitting: Physical Therapy

## 2016-03-29 ENCOUNTER — Ambulatory Visit: Payer: Medicare Other | Admitting: Physical Therapy

## 2016-03-29 DIAGNOSIS — G8929 Other chronic pain: Secondary | ICD-10-CM | POA: Diagnosis not present

## 2016-03-29 DIAGNOSIS — M6281 Muscle weakness (generalized): Secondary | ICD-10-CM | POA: Diagnosis not present

## 2016-03-29 DIAGNOSIS — M545 Low back pain: Secondary | ICD-10-CM | POA: Diagnosis not present

## 2016-03-29 DIAGNOSIS — M62830 Muscle spasm of back: Secondary | ICD-10-CM

## 2016-03-29 NOTE — Therapy (Signed)
[facility] [address] [place], [place], [address] Phone: [phone]   Fax:  [phone]  Physical Therapy Treatment  Patient Details  Name: Sherry Thompson MRN: [id number] Date of Birth: 08-Feb-1946 Referring Provider: Dr. Justin Mend  Encounter Date: 03/29/2016      PT End of Session - 03/29/16 1457    Visit Number 9   Number of Visits 10   Date for PT Re-Evaluation 04/17/16   Authorization Type Medicare G codes;  KX at visit 15   PT Start Time 1451   PT Stop Time 1548   PT Time Calculation (min) 57 min   Activity Tolerance Patient limited by pain   Behavior During Therapy [facility] for tasks assessed/performed      Past Medical History:  Diagnosis Date  . Chronic fatigue syndrome   . Depression   . Fibromyalgia   . Osteopenia   . Psoriasis     History reviewed. No pertinent surgical history.  There were no vitals filed for this visit.      Subjective Assessment - 03/29/16 1452    Subjective Pt reports feeling about the same. Exercsies at last vist did not increase pain.    Pertinent History osteopenia;  fibromyalgia;  chronic fatigue syndrome   Limitations Sitting;House hold activities;Lifting   How long can you sit comfortably? 1 hour   How long can you walk comfortably? 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 Yes   Pain Score 7    Pain Location Back   Pain Orientation Lower   Pain Descriptors / Indicators Aching;Sore   Pain Type Chronic pain   Pain Onset More than a month ago   Pain Frequency Constant                         [facility] Adult PT Treatment/Exercise - 03/29/16 0001      Lumbar Exercises: Stretches   Single Knee to Chest Stretch 2 reps;10 seconds   Double Knee to Chest Stretch 2 reps;10 seconds   Piriformis  Stretch 2 reps;10 seconds     Lumbar Exercises: Supine   Heel Slides 10 reps   Bent Knee Raise 20 reps   Other Supine Lumbar Exercises Ball squeeze with abdominal brace     Moist Heat Therapy   Number Minutes Moist Heat 15 Minutes   Moist Heat Location Lumbar Spine     Electrical Stimulation   Electrical Stimulation Location bilateral lumbar   Electrical Stimulation Action IFC   Electrical Stimulation Parameters to tolerate   Electrical Stimulation Goals Pain     Manual Therapy   Manual Therapy Soft tissue mobilization   Manual therapy comments Pt side lying with pillow between knees  light touch   Soft tissue mobilization to Bil thoracic and lumbar paraspinals                  PT Short Term Goals - 03/27/16 1511      PT SHORT TERM GOAL #1   Title The patient will demonstrate basic understanding of postural correction, osteopenia precautions and basic HEP  03/20/16   Time 4   Period Weeks   Status Achieved     PT SHORT TERM GOAL #2   Title The patient will report  a 25% reduction in pain with usual ADLs   Time 4   Period Weeks   Status On-going     PT SHORT TERM GOAL #3   Title The patient will be able to activate transverse abdominals for core stabilization without doing a pelvic tilt or Valsalva maneuver   Time 4   Period Weeks   Status Achieved           PT Long Term Goals - 03/27/16 1512      PT LONG TERM GOAL #1   Title The patient will be independent in safe self progression of HEP and/or return to yoga    04/17/16   Time 8   Period Weeks   Status On-going     PT LONG TERM GOAL #2   Title The patient will report a 50% improvement in daily pain and reduction of spasms   Time 8   Period Weeks   Status On-going     PT LONG TERM GOAL #3   Title The patient will demonstrate knowledge of proper body mechanics with ADLs for self management of chronic back pain and osteopenia   Time 8   Period Weeks   Status On-going     PT LONG TERM GOAL  #4   Title The patient will have core and hip strength at least 4-/5 needed for household ADLs    Time 8   Period Weeks   Status On-going     PT LONG TERM GOAL #5   Title FOTO functional outcome score improved from 51% limitation to 43% indicating improved function with less pain   Time 8   Period Weeks   Status On-going               Plan - 03/29/16 1540    Clinical Impression Statement Pt reports feeling about the same. Did a lot of house work yesterday which may have aggrivated back. Pt able to tolerate all exercises and stretches well. Pt hesistant to try new exercises for fear of aggrivating fibro myalgia. Soft tissue mobilization with patient in side lying to increase circulation and relax tissues. Pt will continue to benefit from skilled therapy for core strengthening and flexibility and managment of pain symptoms.    Rehab Potential Good   Clinical Impairments Affecting Rehab Potential osteopenia;  fibromyalgia;  chronic fatigue syndrome;  has TENS unit at home; prone to severe spasms;  had major flare up with acupuncture in the past   PT Frequency 2x / week   PT Duration 8 weeks   PT Treatment/Interventions ADLs/Self Care Home Management;Electrical Stimulation;Ultrasound;Moist Heat;Traction;Patient/family education;Therapeutic exercise;Taping;Manual techniques   PT Next Visit Plan proceed very slowly with ex progression secondary to fibromyalgia and prone to flare ups;  neuroscience of pain education;  abdominal brace progression with small movements of UE/LE in supine and progress to sitting and standing as tolerated;  e-stim/heat concurrently or after for pain control   Consulted and Agree with Plan of Care Patient      Patient will benefit from skilled therapeutic intervention in order to improve the following deficits and impairments:  Decreased range of motion, Decreased strength, Increased muscle spasms, Pain  Visit Diagnosis: Chronic bilateral low back pain without  sciatica  Muscle spasm of back  Muscle weakness (generalized)     Problem List There are no active problems to display for this patient.   Mikle Bosworth PTA 03/29/2016, 4:14 PM  [facility] [address]  [place], [place], [address] Phone: [phone]   Fax:  [phone]  Name: Sherry Thompson MRN: [id number] Date of Birth: 1946-04-07

## 2016-04-02 DIAGNOSIS — L400 Psoriasis vulgaris: Secondary | ICD-10-CM | POA: Diagnosis not present

## 2016-04-03 ENCOUNTER — Ambulatory Visit: Payer: Medicare Other | Admitting: Physical Therapy

## 2016-04-03 DIAGNOSIS — M545 Low back pain, unspecified: Secondary | ICD-10-CM

## 2016-04-03 DIAGNOSIS — M6281 Muscle weakness (generalized): Secondary | ICD-10-CM | POA: Diagnosis not present

## 2016-04-03 DIAGNOSIS — M62830 Muscle spasm of back: Secondary | ICD-10-CM

## 2016-04-03 DIAGNOSIS — G8929 Other chronic pain: Secondary | ICD-10-CM | POA: Diagnosis not present

## 2016-04-03 NOTE — Therapy (Signed)
[facility] [address] [place], [place], [address] Phone: [phone]   Fax:  [phone]  Physical Therapy Treatment/Discharge Summary  Patient Details  Name: Sherry Thompson MRN: [id number] Date of Birth: [birth year] Referring Provider: Dr. Justin Mend  Encounter Date: 04/03/2016      PT End of Session - 04/03/16 2022    Visit Number 10   Number of Visits 10   Date for PT Re-Evaluation 04/17/16   Authorization Type Medicare G codes;  KX at visit 15   PT Start Time 1502   PT Stop Time 1540   PT Time Calculation (min) 38 min   Activity Tolerance Patient limited by pain      Past Medical History:  Diagnosis Date  . Chronic fatigue syndrome   . Depression   . Fibromyalgia   . Osteopenia   . Psoriasis     No past surgical history on file.  There were no vitals filed for this visit.      Subjective Assessment - 04/03/16 1507    Subjective Arrives 17 min late.  "Things are not going well.  My ribs are still sore when I lie on that side. Injured when I fell in the closet."  I don't know if the therapy might be aggravating my ribs.  The fibromyalgia is flared.  It's hard for me to do the strengthening ex's but the stretches are OK"     Pertinent History osteopenia;  fibromyalgia;  chronic fatigue syndrome   Currently in Pain? Yes   Pain Score 8    Pain Location Back   Pain Orientation Lower   Pain Type Chronic pain   Pain Onset More than a month ago   Pain Frequency Constant            [facility] PT Assessment - 04/03/16 0001      Observation/Other Assessments   Focus on Therapeutic Outcomes (FOTO)  57%     AROM   Lumbar Flexion 50   Lumbar Extension 10   Lumbar - Right Side Bend 20   Lumbar - Left Side Bend 20     Strength   Right Hip ABduction 4/5   Left Hip ADduction 4/5   Lumbar Flexion 3+/5   Lumbar Extension 3+/5                     [facility] Adult PT Treatment/Exercise - 04/03/16  0001      Lumbar Exercises: Supine   Ab Set 5 reps   Isometric Hip Flexion 5 reps   Other Supine Lumbar Exercises review of decompression series from initial HEP     Moist Heat Therapy   Number Minutes Moist Heat 15 Minutes   Moist Heat Location Lumbar Spine     Electrical Stimulation   Electrical Stimulation Location bilateral lumbar   Electrical Stimulation Action IFC   Electrical Stimulation Parameters 10 ma 15 min   Electrical Stimulation Goals Pain                  PT Short Term Goals - 04/03/16 1513      PT SHORT TERM GOAL #1   Title The patient will demonstrate basic understanding of postural correction, osteopenia precautions and basic HEP  03/20/16   Status Achieved     PT SHORT TERM GOAL #2   Title The patient will report a 25% reduction in pain with usual ADLs   Status Not Met  PT SHORT TERM GOAL #3   Title The patient will be able to activate transverse abdominals for core stabilization without doing a pelvic tilt or Valsalva maneuver   Status Achieved           PT Long Term Goals - 04-19-2016 1513      PT LONG TERM GOAL #1   Title The patient will be independent in safe self progression of HEP and/or return to yoga    04/17/16   Status Partially Met     PT LONG TERM GOAL #2   Title The patient will report a 50% improvement in daily pain and reduction of spasms   Status Not Met     PT LONG TERM GOAL #3   Title The patient will demonstrate knowledge of proper body mechanics with ADLs for self management of chronic back pain and osteopenia   Status Partially Met     PT LONG TERM GOAL #4   Title The patient will have core and hip strength at least 4-/5 needed for household ADLs    Status Partially Met     PT LONG TERM GOAL #5   Title FOTO functional outcome score improved from 51% limitation to 43% indicating improved function with less pain   Status Not Met               Plan - 04/19/16 2023    Clinical Impression Statement  The patient reports that her rib pain is getting worse which she initially attributes to PT as the aggravating factor although she states she is able to do most of her stretches, decompression excercises and supine abdominal bracing without worsening her pain.  She expresses increased stress with her numerous appointments and including light therapy multiple days a week for treatment of psoriasis in addition to other medical appointments.  She feels overwhelmed and after discussing this in addition to her acute rib pain, we decided to discharge her from PT at this time.   She has made minimal progress toward goals secondary to her pain chronicity and psychosocial factors.  She may have a better outcome at a later time after her acute rib pain subsides and her psoriasis appointments are completed.       PHYSICAL THERAPY DISCHARGE SUMMARY  Visits from Start of Care: 10  Current functional level related to goals / functional outcomes: See clinical impressions above   Remaining deficits: As above   Education / Equipment: HEP Plan: Patient agrees to discharge.  Patient goals were partially met. Patient is being discharged due to the patient's request.  ?????        Patient will benefit from skilled therapeutic intervention in order to improve the following deficits and impairments:     Visit Diagnosis: Chronic bilateral low back pain without sciatica  Muscle spasm of back  Muscle weakness (generalized)       G-Codes - 04/19/2016 1526    Functional Assessment Tool Used FOTO ; clinical judgement    Functional Limitation Mobility: Walking and moving around   Mobility: Walking and Moving Around Current Status (I9518) --   Mobility: Walking and Moving Around Goal Status [phone]) At least 20 percent but less than 40 percent impaired, limited or restricted   Mobility: Walking and Moving Around Discharge Status [phone]) At least 40 percent but less than 60 percent impaired, limited or  restricted      Problem List There are no active problems to display for this patient.  Ruben Im, PT 04-19-16  8:32 PM Phone: [phone] Fax: [phone]  [name] 04/03/2016, 8:30 PM  [facility] [address], [place], [place], [address] Phone: [phone]   Fax:  [phone]  Name: Sherry Thompson MRN: [id number] Date of Birth: 08/11/1945

## 2016-04-04 DIAGNOSIS — L400 Psoriasis vulgaris: Secondary | ICD-10-CM | POA: Diagnosis not present

## 2016-04-04 DIAGNOSIS — L815 Leukoderma, not elsewhere classified: Secondary | ICD-10-CM | POA: Diagnosis not present

## 2016-04-05 ENCOUNTER — Encounter: Payer: Medicare Other | Admitting: Physical Therapy

## 2016-04-06 DIAGNOSIS — L400 Psoriasis vulgaris: Secondary | ICD-10-CM | POA: Diagnosis not present

## 2016-04-06 DIAGNOSIS — F339 Major depressive disorder, recurrent, unspecified: Secondary | ICD-10-CM | POA: Diagnosis not present

## 2016-04-09 DIAGNOSIS — L400 Psoriasis vulgaris: Secondary | ICD-10-CM | POA: Diagnosis not present

## 2016-04-10 ENCOUNTER — Encounter: Payer: Medicare Other | Admitting: Physical Therapy

## 2016-04-11 DIAGNOSIS — L400 Psoriasis vulgaris: Secondary | ICD-10-CM | POA: Diagnosis not present

## 2016-04-12 ENCOUNTER — Encounter: Payer: Medicare Other | Admitting: Physical Therapy

## 2016-04-13 DIAGNOSIS — F339 Major depressive disorder, recurrent, unspecified: Secondary | ICD-10-CM | POA: Diagnosis not present

## 2016-04-18 DIAGNOSIS — L400 Psoriasis vulgaris: Secondary | ICD-10-CM | POA: Diagnosis not present

## 2016-04-18 DIAGNOSIS — F339 Major depressive disorder, recurrent, unspecified: Secondary | ICD-10-CM | POA: Diagnosis not present

## 2016-04-20 DIAGNOSIS — L400 Psoriasis vulgaris: Secondary | ICD-10-CM | POA: Diagnosis not present

## 2016-04-25 DIAGNOSIS — L400 Psoriasis vulgaris: Secondary | ICD-10-CM | POA: Diagnosis not present

## 2016-04-26 DIAGNOSIS — F339 Major depressive disorder, recurrent, unspecified: Secondary | ICD-10-CM | POA: Diagnosis not present

## 2016-04-27 DIAGNOSIS — L400 Psoriasis vulgaris: Secondary | ICD-10-CM | POA: Diagnosis not present

## 2016-04-30 DIAGNOSIS — L400 Psoriasis vulgaris: Secondary | ICD-10-CM | POA: Diagnosis not present

## 2016-05-02 DIAGNOSIS — L400 Psoriasis vulgaris: Secondary | ICD-10-CM | POA: Diagnosis not present

## 2016-05-04 DIAGNOSIS — L400 Psoriasis vulgaris: Secondary | ICD-10-CM | POA: Diagnosis not present

## 2016-05-06 DIAGNOSIS — F339 Major depressive disorder, recurrent, unspecified: Secondary | ICD-10-CM | POA: Diagnosis not present

## 2016-05-07 DIAGNOSIS — L400 Psoriasis vulgaris: Secondary | ICD-10-CM | POA: Diagnosis not present

## 2016-05-11 DIAGNOSIS — L400 Psoriasis vulgaris: Secondary | ICD-10-CM | POA: Diagnosis not present

## 2016-05-14 DIAGNOSIS — L400 Psoriasis vulgaris: Secondary | ICD-10-CM | POA: Diagnosis not present

## 2016-05-16 DIAGNOSIS — L400 Psoriasis vulgaris: Secondary | ICD-10-CM | POA: Diagnosis not present

## 2016-05-17 DIAGNOSIS — F339 Major depressive disorder, recurrent, unspecified: Secondary | ICD-10-CM | POA: Diagnosis not present

## 2016-05-18 DIAGNOSIS — L400 Psoriasis vulgaris: Secondary | ICD-10-CM | POA: Diagnosis not present

## 2016-05-21 DIAGNOSIS — L400 Psoriasis vulgaris: Secondary | ICD-10-CM | POA: Diagnosis not present

## 2016-05-23 DIAGNOSIS — L400 Psoriasis vulgaris: Secondary | ICD-10-CM | POA: Diagnosis not present

## 2016-05-24 DIAGNOSIS — F339 Major depressive disorder, recurrent, unspecified: Secondary | ICD-10-CM | POA: Diagnosis not present

## 2016-05-25 DIAGNOSIS — L400 Psoriasis vulgaris: Secondary | ICD-10-CM | POA: Diagnosis not present

## 2016-05-28 DIAGNOSIS — L400 Psoriasis vulgaris: Secondary | ICD-10-CM | POA: Diagnosis not present

## 2016-05-30 DIAGNOSIS — L400 Psoriasis vulgaris: Secondary | ICD-10-CM | POA: Diagnosis not present

## 2016-05-31 DIAGNOSIS — F339 Major depressive disorder, recurrent, unspecified: Secondary | ICD-10-CM | POA: Diagnosis not present

## 2016-06-01 DIAGNOSIS — L400 Psoriasis vulgaris: Secondary | ICD-10-CM | POA: Diagnosis not present

## 2016-06-04 DIAGNOSIS — L400 Psoriasis vulgaris: Secondary | ICD-10-CM | POA: Diagnosis not present

## 2016-06-06 DIAGNOSIS — L400 Psoriasis vulgaris: Secondary | ICD-10-CM | POA: Diagnosis not present

## 2016-06-07 DIAGNOSIS — F339 Major depressive disorder, recurrent, unspecified: Secondary | ICD-10-CM | POA: Diagnosis not present

## 2016-06-08 DIAGNOSIS — L400 Psoriasis vulgaris: Secondary | ICD-10-CM | POA: Diagnosis not present

## 2016-06-11 DIAGNOSIS — L400 Psoriasis vulgaris: Secondary | ICD-10-CM | POA: Diagnosis not present

## 2016-06-13 DIAGNOSIS — L400 Psoriasis vulgaris: Secondary | ICD-10-CM | POA: Diagnosis not present

## 2016-06-14 DIAGNOSIS — F339 Major depressive disorder, recurrent, unspecified: Secondary | ICD-10-CM | POA: Diagnosis not present

## 2016-06-15 DIAGNOSIS — L400 Psoriasis vulgaris: Secondary | ICD-10-CM | POA: Diagnosis not present

## 2016-06-18 DIAGNOSIS — L400 Psoriasis vulgaris: Secondary | ICD-10-CM | POA: Diagnosis not present

## 2016-06-20 DIAGNOSIS — L400 Psoriasis vulgaris: Secondary | ICD-10-CM | POA: Diagnosis not present

## 2016-06-21 ENCOUNTER — Other Ambulatory Visit: Payer: Self-pay | Admitting: Family Medicine

## 2016-06-21 DIAGNOSIS — N83209 Unspecified ovarian cyst, unspecified side: Secondary | ICD-10-CM

## 2016-06-22 DIAGNOSIS — L400 Psoriasis vulgaris: Secondary | ICD-10-CM | POA: Diagnosis not present

## 2016-06-25 DIAGNOSIS — L400 Psoriasis vulgaris: Secondary | ICD-10-CM | POA: Diagnosis not present

## 2016-06-27 DIAGNOSIS — L400 Psoriasis vulgaris: Secondary | ICD-10-CM | POA: Diagnosis not present

## 2016-06-28 DIAGNOSIS — F339 Major depressive disorder, recurrent, unspecified: Secondary | ICD-10-CM | POA: Diagnosis not present

## 2016-06-29 DIAGNOSIS — L400 Psoriasis vulgaris: Secondary | ICD-10-CM | POA: Diagnosis not present

## 2016-07-03 ENCOUNTER — Ambulatory Visit
Admission: RE | Admit: 2016-07-03 | Discharge: 2016-07-03 | Disposition: A | Discharge status: Home / Self Care | Payer: Medicare Other | Source: Ambulatory Visit | Attending: Family Medicine | Admitting: Family Medicine

## 2016-07-03 DIAGNOSIS — N83201 Unspecified ovarian cyst, right side: Secondary | ICD-10-CM | POA: Diagnosis not present

## 2016-07-03 DIAGNOSIS — N83209 Unspecified ovarian cyst, unspecified side: Secondary | ICD-10-CM

## 2016-07-04 DIAGNOSIS — L400 Psoriasis vulgaris: Secondary | ICD-10-CM | POA: Diagnosis not present

## 2016-07-05 DIAGNOSIS — F339 Major depressive disorder, recurrent, unspecified: Secondary | ICD-10-CM | POA: Diagnosis not present

## 2016-07-06 DIAGNOSIS — L400 Psoriasis vulgaris: Secondary | ICD-10-CM | POA: Diagnosis not present

## 2016-07-09 DIAGNOSIS — L400 Psoriasis vulgaris: Secondary | ICD-10-CM | POA: Diagnosis not present

## 2016-07-11 DIAGNOSIS — L400 Psoriasis vulgaris: Secondary | ICD-10-CM | POA: Diagnosis not present

## 2016-07-12 DIAGNOSIS — F339 Major depressive disorder, recurrent, unspecified: Secondary | ICD-10-CM | POA: Diagnosis not present

## 2016-07-13 DIAGNOSIS — L400 Psoriasis vulgaris: Secondary | ICD-10-CM | POA: Diagnosis not present

## 2016-07-16 DIAGNOSIS — L400 Psoriasis vulgaris: Secondary | ICD-10-CM | POA: Diagnosis not present

## 2016-07-18 DIAGNOSIS — L400 Psoriasis vulgaris: Secondary | ICD-10-CM | POA: Diagnosis not present

## 2016-07-23 DIAGNOSIS — L400 Psoriasis vulgaris: Secondary | ICD-10-CM | POA: Diagnosis not present

## 2016-07-26 DIAGNOSIS — F339 Major depressive disorder, recurrent, unspecified: Secondary | ICD-10-CM | POA: Diagnosis not present

## 2016-08-08 DIAGNOSIS — F339 Major depressive disorder, recurrent, unspecified: Secondary | ICD-10-CM | POA: Diagnosis not present

## 2016-08-15 DIAGNOSIS — F339 Major depressive disorder, recurrent, unspecified: Secondary | ICD-10-CM | POA: Diagnosis not present

## 2016-08-20 DIAGNOSIS — Z5181 Encounter for therapeutic drug level monitoring: Secondary | ICD-10-CM | POA: Diagnosis not present

## 2016-08-20 DIAGNOSIS — E785 Hyperlipidemia, unspecified: Secondary | ICD-10-CM | POA: Diagnosis not present

## 2016-08-20 DIAGNOSIS — N83201 Unspecified ovarian cyst, right side: Secondary | ICD-10-CM | POA: Diagnosis not present

## 2016-08-20 DIAGNOSIS — E559 Vitamin D deficiency, unspecified: Secondary | ICD-10-CM | POA: Diagnosis not present

## 2016-08-20 DIAGNOSIS — F331 Major depressive disorder, recurrent, moderate: Secondary | ICD-10-CM | POA: Diagnosis not present

## 2016-08-20 DIAGNOSIS — R5382 Chronic fatigue, unspecified: Secondary | ICD-10-CM | POA: Diagnosis not present

## 2016-08-20 DIAGNOSIS — Z0000 Encounter for general adult medical examination without abnormal findings: Secondary | ICD-10-CM | POA: Diagnosis not present

## 2016-08-20 DIAGNOSIS — R1909 Other intra-abdominal and pelvic swelling, mass and lump: Secondary | ICD-10-CM | POA: Diagnosis not present

## 2016-08-20 DIAGNOSIS — E039 Hypothyroidism, unspecified: Secondary | ICD-10-CM | POA: Diagnosis not present

## 2016-08-20 DIAGNOSIS — Z1211 Encounter for screening for malignant neoplasm of colon: Secondary | ICD-10-CM | POA: Diagnosis not present

## 2016-08-22 DIAGNOSIS — F339 Major depressive disorder, recurrent, unspecified: Secondary | ICD-10-CM | POA: Diagnosis not present

## 2016-08-29 DIAGNOSIS — F339 Major depressive disorder, recurrent, unspecified: Secondary | ICD-10-CM | POA: Diagnosis not present

## 2016-09-05 DIAGNOSIS — F339 Major depressive disorder, recurrent, unspecified: Secondary | ICD-10-CM | POA: Diagnosis not present

## 2016-09-05 DIAGNOSIS — Z1211 Encounter for screening for malignant neoplasm of colon: Secondary | ICD-10-CM | POA: Diagnosis not present

## 2016-09-12 DIAGNOSIS — F339 Major depressive disorder, recurrent, unspecified: Secondary | ICD-10-CM | POA: Diagnosis not present

## 2016-09-19 DIAGNOSIS — F339 Major depressive disorder, recurrent, unspecified: Secondary | ICD-10-CM | POA: Diagnosis not present

## 2016-09-26 DIAGNOSIS — F339 Major depressive disorder, recurrent, unspecified: Secondary | ICD-10-CM | POA: Diagnosis not present

## 2016-10-12 DIAGNOSIS — F339 Major depressive disorder, recurrent, unspecified: Secondary | ICD-10-CM | POA: Diagnosis not present

## 2016-10-17 ENCOUNTER — Other Ambulatory Visit: Payer: Self-pay | Admitting: Family Medicine

## 2016-10-17 DIAGNOSIS — N83209 Unspecified ovarian cyst, unspecified side: Secondary | ICD-10-CM

## 2016-10-17 DIAGNOSIS — F339 Major depressive disorder, recurrent, unspecified: Secondary | ICD-10-CM | POA: Diagnosis not present

## 2016-10-24 DIAGNOSIS — F331 Major depressive disorder, recurrent, moderate: Secondary | ICD-10-CM | POA: Diagnosis not present

## 2016-10-24 DIAGNOSIS — F339 Major depressive disorder, recurrent, unspecified: Secondary | ICD-10-CM | POA: Diagnosis not present

## 2016-10-31 DIAGNOSIS — F339 Major depressive disorder, recurrent, unspecified: Secondary | ICD-10-CM | POA: Diagnosis not present

## 2016-10-31 DIAGNOSIS — F331 Major depressive disorder, recurrent, moderate: Secondary | ICD-10-CM | POA: Diagnosis not present

## 2016-11-07 DIAGNOSIS — F331 Major depressive disorder, recurrent, moderate: Secondary | ICD-10-CM | POA: Diagnosis not present

## 2016-11-07 DIAGNOSIS — F339 Major depressive disorder, recurrent, unspecified: Secondary | ICD-10-CM | POA: Diagnosis not present

## 2016-11-14 DIAGNOSIS — F339 Major depressive disorder, recurrent, unspecified: Secondary | ICD-10-CM | POA: Diagnosis not present

## 2016-11-14 DIAGNOSIS — F331 Major depressive disorder, recurrent, moderate: Secondary | ICD-10-CM | POA: Diagnosis not present

## 2016-11-24 DIAGNOSIS — F339 Major depressive disorder, recurrent, unspecified: Secondary | ICD-10-CM | POA: Diagnosis not present

## 2016-11-28 DIAGNOSIS — F339 Major depressive disorder, recurrent, unspecified: Secondary | ICD-10-CM | POA: Diagnosis not present

## 2016-11-29 DIAGNOSIS — L738 Other specified follicular disorders: Secondary | ICD-10-CM | POA: Diagnosis not present

## 2016-11-29 DIAGNOSIS — D1801 Hemangioma of skin and subcutaneous tissue: Secondary | ICD-10-CM | POA: Diagnosis not present

## 2016-11-29 DIAGNOSIS — L821 Other seborrheic keratosis: Secondary | ICD-10-CM | POA: Diagnosis not present

## 2016-11-29 DIAGNOSIS — D225 Melanocytic nevi of trunk: Secondary | ICD-10-CM | POA: Diagnosis not present

## 2016-11-29 DIAGNOSIS — L400 Psoriasis vulgaris: Secondary | ICD-10-CM | POA: Diagnosis not present

## 2016-11-29 DIAGNOSIS — L814 Other melanin hyperpigmentation: Secondary | ICD-10-CM | POA: Diagnosis not present

## 2016-11-29 DIAGNOSIS — L905 Scar conditions and fibrosis of skin: Secondary | ICD-10-CM | POA: Diagnosis not present

## 2016-12-05 DIAGNOSIS — F339 Major depressive disorder, recurrent, unspecified: Secondary | ICD-10-CM | POA: Diagnosis not present

## 2016-12-19 DIAGNOSIS — F339 Major depressive disorder, recurrent, unspecified: Secondary | ICD-10-CM | POA: Diagnosis not present

## 2016-12-26 DIAGNOSIS — F339 Major depressive disorder, recurrent, unspecified: Secondary | ICD-10-CM | POA: Diagnosis not present

## 2017-01-02 ENCOUNTER — Other Ambulatory Visit: Payer: Self-pay | Admitting: Family Medicine

## 2017-01-02 DIAGNOSIS — M546 Pain in thoracic spine: Secondary | ICD-10-CM

## 2017-01-02 DIAGNOSIS — F339 Major depressive disorder, recurrent, unspecified: Secondary | ICD-10-CM | POA: Diagnosis not present

## 2017-01-04 ENCOUNTER — Other Ambulatory Visit: Payer: Medicare Other

## 2017-01-09 ENCOUNTER — Ambulatory Visit
Admission: RE | Admit: 2017-01-09 | Discharge: 2017-01-09 | Disposition: A | Discharge status: Home / Self Care | Payer: Medicare Other | Source: Ambulatory Visit | Attending: Family Medicine | Admitting: Family Medicine

## 2017-01-09 DIAGNOSIS — D259 Leiomyoma of uterus, unspecified: Secondary | ICD-10-CM | POA: Diagnosis not present

## 2017-01-09 DIAGNOSIS — N83209 Unspecified ovarian cyst, unspecified side: Secondary | ICD-10-CM

## 2017-01-11 DIAGNOSIS — F339 Major depressive disorder, recurrent, unspecified: Secondary | ICD-10-CM | POA: Diagnosis not present

## 2017-01-16 DIAGNOSIS — F339 Major depressive disorder, recurrent, unspecified: Secondary | ICD-10-CM | POA: Diagnosis not present

## 2017-01-17 ENCOUNTER — Ambulatory Visit
Admission: RE | Admit: 2017-01-17 | Discharge: 2017-01-17 | Disposition: A | Discharge status: Home / Self Care | Payer: Medicare Other | Source: Ambulatory Visit | Attending: Family Medicine | Admitting: Family Medicine

## 2017-01-17 DIAGNOSIS — M546 Pain in thoracic spine: Secondary | ICD-10-CM

## 2017-01-17 DIAGNOSIS — M5124 Other intervertebral disc displacement, thoracic region: Secondary | ICD-10-CM | POA: Diagnosis not present

## 2017-01-31 ENCOUNTER — Ambulatory Visit: Payer: Medicare Other | Attending: Family Medicine | Admitting: Physical Therapy

## 2017-01-31 DIAGNOSIS — M545 Low back pain: Secondary | ICD-10-CM | POA: Diagnosis not present

## 2017-01-31 DIAGNOSIS — M6281 Muscle weakness (generalized): Secondary | ICD-10-CM | POA: Insufficient documentation

## 2017-01-31 DIAGNOSIS — G8929 Other chronic pain: Secondary | ICD-10-CM | POA: Diagnosis not present

## 2017-01-31 DIAGNOSIS — M62830 Muscle spasm of back: Secondary | ICD-10-CM | POA: Insufficient documentation

## 2017-01-31 NOTE — Patient Instructions (Signed)
   Whole leg press down 5 sec hold 5x   Draw in abdominals:  Pillow squeeze, marching 5-8 reps    Ruben Im PT [facility] [address], [place], [place] [address] Phone # [phone] Fax [phone]

## 2017-02-01 NOTE — Therapy (Signed)
[facility] [address] [place], [place], [address] Phone: [phone]   Fax:  [phone]  Physical Therapy Evaluation  Patient Details  Name: Sherry Thompson MRN: [id number] Date of Birth: [birth year] Referring Provider: Dr. Maurice Small  Encounter Date: 01/31/2017      PT End of Session - 01/31/17 1641    Visit Number 1   Date for PT Re-Evaluation 03/28/17   Authorization Type  Gcodes: KX at visit 15   PT Start Time 3016   PT Stop Time 1535   PT Time Calculation (min) 50 min   Activity Tolerance Patient limited by pain      Past Medical History:  Diagnosis Date  . Chronic fatigue syndrome   . Depression   . Fibromyalgia   . Osteopenia   . Psoriasis     No past surgical history on file.  There were no vitals filed for this visit.       Subjective Assessment - 01/31/17 1450    Subjective Worsening of chronic low back pain;  Pain is more widespread from low back to mid and upper back.  I can't raise my head up off the pillow.  I fell again 3 months ago which didn't help.  I fell backward both times I fell.     Pertinent History hx of falls;  osteopenia; fibromyalgia; depression   Limitations House hold activities;Walking;Lifting;Sitting   How long can you sit comfortably? 30 min   How long can you walk comfortably? 30 min with walking the dog   Diagnostic tests MRI mild degenerative changes mid thoracic and lower cervical    Patient Stated Goals decrease pain   Currently in Pain? Yes   Pain Score 7    Pain Location Back   Pain Orientation Mid;Upper;Lower   Pain Type Chronic pain   Pain Onset More than a month ago   Pain Frequency Constant   Aggravating Factors  reaching forward to turn on the faucet, mornings, bending, lifting > 10#; lying supine   Pain Relieving Factors sidelying; wine                Objective measurements completed on examination: See above findings.                   PT Education - 01/31/17 1640    Education provided Yes   Education Details abdominal brace with march and pillow squeeze; whole leg press down   Person(s) Educated Patient   Methods Explanation;Demonstration;Handout   Comprehension Verbalized understanding;Returned demonstration          PT Short Term Goals - 02/01/17 0824      PT SHORT TERM GOAL #1   Title The patient will demonstrate basic understanding of postural correction, osteopenia precautions and basic HEP     Time 4   Period Weeks   Status New   Target Date 03/01/17     PT SHORT TERM GOAL #2   Title The patient will report a 20% reduction in pain with usual ADLs   Time 4   Period Weeks   Status New     PT SHORT TERM GOAL #3   Title The patient will be able to activate transverse abdominals for core stabilization without doing a pelvic tilt or Valsalva maneuver   Time 4   Period Weeks   Status New           PT Long Term Goals - 02/01/17 0109  PT LONG TERM GOAL #1   Title The patient will be independent in safe self progression of HEP    Time 8   Period Weeks   Target Date 03/29/17     PT LONG TERM GOAL #2   Title The patient will be able to sit for 1 hour with 2-3 breaks and pain at a manageable level in order to have lunch with her friends or family or attend book club   Time 8   Period Weeks   Status New     PT LONG TERM GOAL #3   Title The patient will demonstrate knowledge of proper body mechanics with ADLs for self management of chronic back pain and osteopenia including standing at the sink   Time 8   Period Weeks   Status New     PT LONG TERM GOAL #4   Title The patient will have core and hip strength at least 4/5 needed for household ADLs and lifting her grandson   Time 8   Period Weeks   Status New     PT LONG TERM GOAL #5   Title FOTO functional outcome score improved from 60% limitation to 49% indicating improved function with less pain   Time 8    Period Weeks   Status New                Plan - 01/31/17 1637    Clinical Impression Statement The patient has a long history of low back pain which she feels is worsening over time over the last several months and now extends into her mid and upper back.  She is limited in her very limited in her activity level and her pain is aggravated with reaching to turn on the faucet, bending, mornings and in the mornings.  She is unable to lift her 16# great grandson and is unable to sit long enough to enjoy lunch with her friends.  Decreased lumbar ROM in all directions and painful.  No directional preference.  Patient is fearful of movement.  Decreased core muscle activation particularly transverse abdominus and multifidi.  Decreased periscapular strength 4-/5.  Increased thoracic kyphosis, forward head and rounded shoulders.  The patient reports she does not have any leisure activities.  She does not feel like having lunch with friends or going to book club.  She will benefit from PT to address these issues.  Progress may be slowed by numerous personal factors.      History and Personal Factors relevant to plan of care: lack of psychosocial support, numerous co-morbidities; changing pain location; fibromyalgia; osteopenia; hx of depression; hx of falls   Clinical Presentation Evolving   Clinical Presentation due to: changing, worsening of pain    Clinical Decision Making Moderate   Rehab Potential Good   PT Frequency 2x / week   PT Duration 8 weeks   PT Treatment/Interventions ADLs/Self Care Home Management;Electrical Stimulation;Cryotherapy;Ultrasound;Traction;Moist Heat;Neuromuscular re-education;Therapeutic exercise;Therapeutic activities;Patient/family education;Manual techniques;Dry needling;Taping   PT Next Visit Plan low level core stabilization; scapular strengthening; functional reactivation; neuroscience of pain education      Patient will benefit from skilled therapeutic  intervention in order to improve the following deficits and impairments:  Pain, Decreased range of motion, Decreased strength  Visit Diagnosis: Chronic bilateral low back pain without sciatica - Plan: PT plan of care cert/re-cert  Muscle spasm of back - Plan: PT plan of care cert/re-cert  Muscle weakness (generalized) - Plan: PT plan of care cert/re-cert  G-Codes - 01/31/17 1635    Functional Assessment Tool Used (Outpatient Only) Clinical impressions; FOTO   Functional Limitation Mobility: Walking and moving around   Mobility: Walking and Moving Around Current Status [phone]) At least 60 percent but less than 80 percent impaired, limited or restricted   Mobility: Walking and Moving Around Goal Status (U1103) At least 40 percent but less than 60 percent impaired, limited or restricted       Problem List There are no active problems to display for this patient.  Ruben Im, PT 02/01/17 8:37 AM Phone: [phone] Fax: [phone]  Sherry Thompson 02/01/2017, 8:36 AM  [facility] [address], [place], [place], [address] Phone: [phone]   Fax:  [phone]  Name: Sherry Thompson MRN: [id number] Date of Birth: 1945-08-21

## 2017-02-05 ENCOUNTER — Encounter: Payer: Medicare Other | Admitting: Physical Therapy

## 2017-02-07 ENCOUNTER — Ambulatory Visit: Payer: Medicare Other | Admitting: Physical Therapy

## 2017-02-07 ENCOUNTER — Encounter: Payer: Self-pay | Admitting: Physical Therapy

## 2017-02-07 DIAGNOSIS — M62830 Muscle spasm of back: Secondary | ICD-10-CM

## 2017-02-07 DIAGNOSIS — M6281 Muscle weakness (generalized): Secondary | ICD-10-CM

## 2017-02-07 DIAGNOSIS — M545 Low back pain: Principal | ICD-10-CM

## 2017-02-07 DIAGNOSIS — G8929 Other chronic pain: Secondary | ICD-10-CM

## 2017-02-07 NOTE — Patient Instructions (Addendum)
Chair Sitting    Sit at edge of seat, spine straight, one leg extended. Put a hand on each thigh and bend forward from the hip, keeping spine straight. Allow hand on extended leg to reach toward toes. Support upper body with other arm. Hold _30__ seconds. Repeat _1__ times per session. Do _2__ sessions per day.  Copyright  VHI. All rights reserved.  Piriformis Stretch, Sitting    Sit, one ankle on opposite knee, same-side hand on crossed knee. Push down on knee, keeping spine straight. Lean torso forward, with flat back, until tension is felt in hamstrings and gluteals of crossed-leg side. Hold __30_ seconds.  Repeat __1_ times per session. Do _2__ sessions per day.  Copyright  VHI. All rights reserved.   Head Press With [place] chin SLIGHTLY toward chest, keep mouth closed. Feel weight on back of head. Increase weight by pressing head down. Hold _3__ seconds. Relax. Repeat __3_ times.   Copyright  VHI. All rights reserved.  Shoulder Press    Press both shoulders down. Hold __3_ seconds. Repeat _3__ times.   If unable to press  both shoulders, lie in position a few sessions until you can.   Copyright  VHI. All rights reserved.  Leg Lengthener: Full    Straighten one leg. Pull toes AND forefoot toward knee, extend heel. Lengthen leg by pulling pelvis away from ribs. Hold __3_ seconds. Relax. Repeat 1 time. Re-bend knee. Do other leg. Each leg _3__ times.    Copyright  VHI. All rights reserved.  Leg Straightener    Straighten one leg down. Hold maximal position _3__ seconds. Re-bend knee. Do other leg. Each leg _3__ times.   Copyright  VHI. All rights reserved.  Adduction: Hip - Knees Together With Pelvic Floor (Hook-Lying)    Lie with hips and knees bent, towel roll between knees. Squeeze pelvic floor while pushing knees together. Hold for __5_ seconds. Rest for _5__ seconds. Repeat _5__ times. Do 1___ times a day.   Copyright  VHI. All rights  reserved.  Bracing With Leg March (Hook-Lying)    With neutral spine, tighten pelvic floor and abdominals and hold. Alternating legs, lift foot _6__ inches and return to floor. Repeat _10__ times. Do __1_ times a day.   Copyright  VHI. All rights reserved.  Isometric Hold With Pelvic Floor (Hook-Lying)    Lie with hips and knees bent. Slowly inhale, and then exhale. Pull navel toward spine and tighten pelvic floor. Hold for __5_ seconds. Continue to breathe in and out during hold. Rest for __5_ seconds. Repeat _5__ times. Do __1_ times a day.   Copyright  VHI. All rights reserved.  [facility] [address], [place], [place] [address] Phone # [phone] Fax [phone]

## 2017-02-07 NOTE — Therapy (Signed)
[facility] [address] [place], [place], [address] Phone: [phone]   Fax:  [phone]  Physical Therapy Treatment  Patient Details  Name: Sherry Thompson MRN: [id number] Date of Birth: Dec 03, 1945 Referring Provider: Dr. Maurice Small  Encounter Date: 02/07/2017      PT End of Session - 02/07/17 1641    Visit Number 2   Date for PT Re-Evaluation 03/28/17   Authorization Type  Gcodes: KX at visit 15   PT Start Time 1530   PT Stop Time 1610   PT Time Calculation (min) 40 min   Activity Tolerance Patient tolerated treatment well   Behavior During Therapy [facility] for tasks assessed/performed      Past Medical History:  Diagnosis Date  . Chronic fatigue syndrome   . Depression   . Fibromyalgia   . Osteopenia   . Psoriasis     History reviewed. No pertinent surgical history.  There were no vitals filed for this visit.      Subjective Assessment - 02/07/17 1539    Subjective I feel better than Tuesday.  I had alot of Fibromyalgia on tuesday increasing her pain so she did not come to therapy.    Pertinent History hx of falls;  osteopenia; fibromyalgia; depression   Limitations House hold activities;Walking;Lifting;Sitting   How long can you sit comfortably? 30 min   How long can you walk comfortably? 30 min with walking the dog   Diagnostic tests MRI mild degenerative changes mid thoracic and lower cervical    Patient Stated Goals decrease pain   Currently in Pain? Yes   Pain Score 4    Pain Location Back   Pain Orientation Lower;Right;Upper   Pain Descriptors / Indicators Aching   Pain Type Chronic pain   Pain Onset More than a month ago   Pain Frequency Constant   Aggravating Factors  reaching forward to turn on the facet, mornings, bending, lifting > 10#; lying supine   Pain Relieving Factors sidleying; wine   Multiple Pain Sites No                         [facility] Adult PT  Treatment/Exercise - 02/07/17 0001      Exercises   Exercises Lumbar     Lumbar Exercises: Stretches   Active Hamstring Stretch 2 reps;30 seconds  sitting   Piriformis Stretch 2 reps;30 seconds  sitting     Lumbar Exercises: Aerobic   Stationary Bike nustep for 8 min level 1                  PT Short Term Goals - 02/07/17 1639      PT SHORT TERM GOAL #1   Title The patient will demonstrate basic understanding of postural correction, osteopenia precautions and basic HEP     Time 4   Period Weeks   Status New     PT SHORT TERM GOAL #2   Title The patient will report a 20% reduction in pain with usual ADLs   Time 4   Period Weeks   Status On-going     PT SHORT TERM GOAL #3   Title The patient will be able to activate transverse abdominals for core stabilization without doing a pelvic tilt or Valsalva maneuver   Time 4   Period Weeks   Status On-going           PT Long Term Goals - 02/01/17 3295  PT LONG TERM GOAL #1   Title The patient will be independent in safe self progression of HEP    Time 8   Period Weeks   Target Date 03/29/17     PT LONG TERM GOAL #2   Title The patient will be able to sit for 1 hour with 2-3 breaks and pain at a manageable level in order to have lunch with her friends or family or attend book club   Time 8   Period Weeks   Status New     PT LONG TERM GOAL #3   Title The patient will demonstrate knowledge of proper body mechanics with ADLs for self management of chronic back pain and osteopenia including standing at the sink   Time 8   Period Weeks   Status New     PT LONG TERM GOAL #4   Title The patient will have core and hip strength at least 4/5 needed for household ADLs and lifting her grandson   Time 8   Period Weeks   Status New     PT LONG TERM GOAL #5   Title FOTO functional outcome score improved from 60% limitation to 49% indicating improved function with less pain   Time 8   Period Weeks   Status  New               Plan - 02/07/17 1617    Clinical Impression Statement Patient is able to contract her lower abdominals with core stability.  Patient is able to stretch but very nervous with her back going out. Therapist had to encourage patient it is alright to feel a pull due to her keeping her back muscles tight due to fear.  Patient was educated on what the readings were in her MRI report.  Patient  will benefit from physical therapy  to improve functional mobility  while reduce pain and helping her understand she is able to move and not reinjur herself.    Rehab Potential Good   PT Frequency 2x / week   PT Duration 8 weeks   PT Treatment/Interventions ADLs/Self Care Home Management;Electrical Stimulation;Cryotherapy;Ultrasound;Traction;Moist Heat;Neuromuscular re-education;Therapeutic exercise;Therapeutic activities;Patient/family education;Manual techniques;Dry needling;Taping   PT Next Visit Plan low level core stabilization; scapular strengthening; functional reactivation; neuroscience of pain education; lifting technique; educate on osteopenia   PT Home Exercise Plan progress as needed   Recommended Other Services MD signed orders   Consulted and Agree with Plan of Care Patient      Patient will benefit from skilled therapeutic intervention in order to improve the following deficits and impairments:  Pain, Decreased range of motion, Decreased strength  Visit Diagnosis: Chronic bilateral low back pain without sciatica  Muscle spasm of back  Muscle weakness (generalized)     Problem List There are no active problems to display for this patient.   Earlie Counts, PT 02/07/17 4:42 PM   [facility] [address] [place], [place], [address] Phone: [phone]   Fax:  [phone]  Name: Sherry Thompson MRN: [id number] Date of Birth: 05/10/1945

## 2017-02-12 ENCOUNTER — Ambulatory Visit: Payer: Medicare Other | Admitting: Physical Therapy

## 2017-02-12 DIAGNOSIS — G8929 Other chronic pain: Secondary | ICD-10-CM | POA: Diagnosis not present

## 2017-02-12 DIAGNOSIS — M545 Low back pain: Principal | ICD-10-CM

## 2017-02-12 DIAGNOSIS — M62830 Muscle spasm of back: Secondary | ICD-10-CM | POA: Diagnosis not present

## 2017-02-12 DIAGNOSIS — M6281 Muscle weakness (generalized): Secondary | ICD-10-CM

## 2017-02-12 NOTE — Therapy (Signed)
[facility] [address] [place], [place], [address] Phone: [phone]   Fax:  [phone]  Physical Therapy Treatment  Patient Details  Name: Sherry Thompson MRN: [id number] Date of Birth: 10-02-1945 Referring Provider: Dr. Maurice Small  Encounter Date: 02/12/2017      PT End of Session - 02/12/17 1706    Visit Number 3   Date for PT Re-Evaluation 03/28/17   Authorization Type  Gcodes: KX at visit 15   PT Start Time 1324  patient late   PT Stop Time 1530   PT Time Calculation (min) 33 min   Activity Tolerance Patient limited by pain      Past Medical History:  Diagnosis Date  . Chronic fatigue syndrome   . Depression   . Fibromyalgia   . Osteopenia   . Psoriasis     No past surgical history on file.  There were no vitals filed for this visit.      Subjective Assessment - 02/12/17 1458    Subjective Patient arrives 13 min late.  Patient states she couldn't find her car keys.  I had difficulty doing the sitting exercises at home so I stopped them.  Patient requests review and modification of HEP.     Pertinent History hx of falls;  osteopenia; fibromyalgia; depression   Diagnostic tests MRI mild degenerative changes mid thoracic and lower cervical    Currently in Pain? Yes   Pain Score 4    Pain Location Back   Pain Orientation Lower   Pain Type Chronic pain                         [facility] Adult PT Treatment/Exercise - 02/12/17 0001      Therapeutic Activites    Therapeutic Activities ADL's;Other Therapeutic Activities   Other Therapeutic Activities log rolling, activity modification to apply skin cream on back for psoriasis without twisting     Neuro Re-ed    Neuro Re-ed Details  transverse abdominus activation     Lumbar Exercises: Stretches   Active Hamstring Stretch 2 reps;30 seconds   Active Hamstring Stretch Limitations supine   Single Knee to Chest Stretch 3 reps    Lower Trunk Rotation 3 reps   Piriformis Stretch 2 reps;30 seconds   Piriformis Stretch Limitations supine     Lumbar Exercises: Seated   Sit to Stand Limitations foam roll pushes 8x low intensity     Lumbar Exercises: Supine   Ab Set 10 reps   AB Set Limitations cues for breathing                PT Education - 02/12/17 1513    Education provided Yes   Education Details supine HS and piriformis stretch   Person(s) Educated Patient   Methods Explanation;Demonstration;Handout   Comprehension Verbalized understanding;Returned demonstration          PT Short Term Goals - 02/07/17 1639      PT SHORT TERM GOAL #1   Title The patient will demonstrate basic understanding of postural correction, osteopenia precautions and basic HEP     Time 4   Period Weeks   Status New     PT SHORT TERM GOAL #2   Title The patient will report a 20% reduction in pain with usual ADLs   Time 4   Period Weeks   Status On-going     PT SHORT TERM GOAL #3   Title The  patient will be able to activate transverse abdominals for core stabilization without doing a pelvic tilt or Valsalva maneuver   Time 4   Period Weeks   Status On-going           PT Long Term Goals - 02/01/17 0825      PT LONG TERM GOAL #1   Title The patient will be independent in safe self progression of HEP    Time 8   Period Weeks   Target Date 03/29/17     PT LONG TERM GOAL #2   Title The patient will be able to sit for 1 hour with 2-3 breaks and pain at a manageable level in order to have lunch with her friends or family or attend book club   Time 8   Period Weeks   Status New     PT LONG TERM GOAL #3   Title The patient will demonstrate knowledge of proper body mechanics with ADLs for self management of chronic back pain and osteopenia including standing at the sink   Time 8   Period Weeks   Status New     PT LONG TERM GOAL #4   Title The patient will have core and hip strength at least 4/5 needed for  household ADLs and lifting her grandson   Time 8   Period Weeks   Status New     PT LONG TERM GOAL #5   Title FOTO functional outcome score improved from 60% limitation to 49% indicating improved function with less pain   Time 8   Period Weeks   Status New               Plan - 02/12/17 1709    Clinical Impression Statement Patient continues to have high sensitivity to pain secondary to chronicity and fibromyalgia.  She needs modications to seated exercises secondary to complaints of mid thoracic pain.  Able to perform similar exercises in supine with less discomfort.  Anticipate slower overall progress secondary to co-morbidities.     Rehab Potential Good   PT Frequency 2x / week   PT Duration 8 weeks   PT Treatment/Interventions ADLs/Self Care Home Management;Electrical Stimulation;Cryotherapy;Ultrasound;Traction;Moist Heat;Neuromuscular re-education;Therapeutic exercise;Therapeutic activities;Patient/family education;Manual techniques;Dry needling;Taping   PT Next Visit Plan low level core stabilization; scapular strengthening; functional reactivation; neuroscience of pain education; lifting technique; educate on osteopenia      Patient will benefit from skilled therapeutic intervention in order to improve the following deficits and impairments:  Pain, Decreased range of motion, Decreased strength  Visit Diagnosis: Chronic bilateral low back pain without sciatica  Muscle spasm of back  Muscle weakness (generalized)     Problem List There are no active problems to display for this patient.  Ruben Im, PT 02/12/17 5:13 PM Phone: [phone] Fax: [phone]  [name] 02/12/2017, 5:13 PM  [facility] [address], [place], [place], [address] Phone: [phone]   Fax:  [phone]  Name: REYNA LORENZI MRN: [id number] Date of Birth: 1946/03/25

## 2017-02-12 NOTE — Patient Instructions (Signed)
   HAMSTRING STRETCH WITH TOWEL  While lying down on your back, hook a towel or strap under  your foot and draw up your leg until a stretch is felt under your leg. calf area.  Keep your knee in a straightened position during the stretch.  Hold 30 seconds, perform 3 repetitions.  2  times a day.     Piriformis (Supine)  Cross legs, right on top. Gently pull other knee toward chest until stretch is felt in buttock/hip of top leg. Hold _30___ seconds. Repeat __3__ times per set. Do __1__ sets per session. Do ___2_ sessions per day.   Copyright  VHI. All rights reserved.      [facility] [address], [place], [place] [address] Phone # [phone] Fax [phone]      [facility] [address] [place], [place] [address] Phone # [phone] Fax [phone]

## 2017-02-14 ENCOUNTER — Ambulatory Visit: Payer: Medicare Other | Admitting: Physical Therapy

## 2017-02-14 ENCOUNTER — Encounter: Payer: Self-pay | Admitting: Physical Therapy

## 2017-02-14 DIAGNOSIS — M62830 Muscle spasm of back: Secondary | ICD-10-CM

## 2017-02-14 DIAGNOSIS — M545 Low back pain: Secondary | ICD-10-CM | POA: Diagnosis not present

## 2017-02-14 DIAGNOSIS — G8929 Other chronic pain: Secondary | ICD-10-CM | POA: Diagnosis not present

## 2017-02-14 DIAGNOSIS — M6281 Muscle weakness (generalized): Secondary | ICD-10-CM

## 2017-02-14 NOTE — Patient Instructions (Addendum)
DO's and DON'T's   Avoid and/or Minimize positions of forward bending ( flexion)  Side bending and rotation of the trunk to the end range  Especially when movements occur together   When your back aches:   Don't sit down   Lie down on your back with a small pillow under your head and one under your knees or as outlined by our therapist. Or, lie in the 90/90 position ( on the floor with your feet and legs on the sofa with knees and hips bent to 90 degrees) Can lay on stomach for 2-3 min on occasion Tying or putting on your shoes:   Don't bend over to tie your shoes or put on socks.  Instead, bring one foot up, cross it over the opposite knee and bend forward (hinge) at the hips to so the task.  Keep your back straight.  If you cannot do this safely, then you need to use long handled assistive devices such as a shoehorn and sock puller.  Exercising:  Don't engage in ballistic types of exercise routines such as high-impact aerobics or jumping rope  Don't do exercises in the gym that bring you forward (abdominal crunches, sit-ups, touching your  toes, knee-to-chest, straight leg raising.)  Follow a regular exercise program that includes a variety of different weight-bearing activities, such as low-impact aerobics, T' ai chi or walking as your physical therapist advises  Do exercises that emphasize return to normal body alignment and strengthening of the muscles that keep your back straight, as outlined in this program or by your therapist  Household tasks:  Don't reach unnecessarily or twist your trunk when mopping, sweeping, vacuuming, raking, making beds, weeding gardens, getting objects ou of cupboards, etc.  Keep your broom, mop, vacuum, or rake close to you and mover your whole body as you move them. Walk over to the area on which you are working. Arrange kitchen, bathroom, and bedroom shelves so that frequently used items may be reached  without excessive bending, twisting, and reaching.  Use a sturdy stool if necessary.  Don't bend from the waist to pick up something up  Off the floor, out of the trunk of your car, or to brush your teeth, wash your face, etc.   Bend at the knees, keeping back straight as possible. Use a reacher if necessary.   Prevention of fracture is the so-called "BOTTOm -Line" in the management of OSTEOPOROSIS. Do not take unnecessary chances in movement. Once a compression fracture occurs, the process is very difficult to control; one fracture is frequently followed by many more.  [facility] [address], [place], [place] [address] Phone # [phone] Fax [phone]

## 2017-02-14 NOTE — Therapy (Signed)
[facility] [address], [place], [place], [address] Phone: [phone]   Fax:  [phone]  Physical Therapy Treatment  Patient Details  Name: Sherry Thompson MRN: [id number] Date of Birth: 04-Apr-1946 Referring Provider: Dr. Maurice Small  Encounter Date: 02/14/2017      PT End of Session - 02/14/17 1501    Visit Number 4   Date for PT Re-Evaluation 03/28/17   Authorization Type  Gcodes: KX at visit 15   PT Start Time 4431   PT Stop Time 1525   PT Time Calculation (min) 40 min   Activity Tolerance Patient limited by pain   Behavior During Therapy [facility] for tasks assessed/performed      Past Medical History:  Diagnosis Date  . Chronic fatigue syndrome   . Depression   . Fibromyalgia   . Osteopenia   . Psoriasis     History reviewed. No pertinent surgical history.  There were no vitals filed for this visit.      Subjective Assessment - 02/14/17 1502    Subjective No change in pain yet.  Today is a bad day du eto a system coming in. I have questions on my exercises.    Pertinent History hx of falls;  osteopenia; fibromyalgia; depression   Limitations House hold activities;Walking;Lifting;Sitting   How long can you sit comfortably? 30 min   How long can you walk comfortably? 30 min with walking the dog   Diagnostic tests MRI mild degenerative changes mid thoracic and lower cervical    Patient Stated Goals decrease pain   Currently in Pain? Yes   Pain Score 7    Pain Location Back   Pain Orientation Lower   Pain Descriptors / Indicators Aching   Pain Type Chronic pain   Pain Onset More than a month ago   Pain Frequency Constant   Aggravating Factors  reaching forward to turn on the facet, mornings, bending , lifting > 10# ; lying supine   Pain Relieving Factors sidely; wine   Multiple Pain Sites No                         [facility] Adult PT Treatment/Exercise - 02/14/17 0001      Lumbar Exercises: Stretches   Active Hamstring Stretch 2 reps;30 seconds   Active Hamstring Stretch Limitations supine   Single Knee to Chest Stretch 3 reps   Double Knee to Chest Stretch 3 reps;30 seconds   Lower Trunk Rotation 3 reps   Piriformis Stretch 2 reps;30 seconds   Piriformis Stretch Limitations supine     Lumbar Exercises: Supine   Bent Knee Raise 10 reps;1 second   Other Supine Lumbar Exercises decompression series   Other Supine Lumbar Exercises hookly ball squeeze 10x with abdominal bracing                PT Education - 02/14/17 1533    Education provided Yes   Education Details do and do not for osteoporosis; reviewed HEP   Person(s) Educated Patient   Methods Explanation;Demonstration;Verbal cues;Handout   Comprehension Returned demonstration;Verbalized understanding          PT Short Term Goals - 02/14/17 1501      PT SHORT TERM GOAL #1   Title The patient will demonstrate basic understanding of postural correction, osteopenia precautions and basic HEP     Time 4   Period Weeks   Status On-going     PT  SHORT TERM GOAL #2   Title The patient will report a 20% reduction in pain with usual ADLs   Baseline no improvement yet   Time 4   Period Weeks   Status On-going     PT SHORT TERM GOAL #3   Title The patient will be able to activate transverse abdominals for core stabilization without doing a pelvic tilt or Valsalva maneuver   Time 4   Period Weeks   Status On-going           PT Long Term Goals - 02/01/17 0825      PT LONG TERM GOAL #1   Title The patient will be independent in safe self progression of HEP    Time 8   Period Weeks   Target Date 03/29/17     PT LONG TERM GOAL #2   Title The patient will be able to sit for 1 hour with 2-3 breaks and pain at a manageable level in order to have lunch with her friends or family or attend book club   Time 8   Period Weeks   Status New     PT LONG TERM GOAL #3   Title The patient  will demonstrate knowledge of proper body mechanics with ADLs for self management of chronic back pain and osteopenia including standing at the sink   Time 8   Period Weeks   Status New     PT LONG TERM GOAL #4   Title The patient will have core and hip strength at least 4/5 needed for household ADLs and lifting her grandson   Time 8   Period Weeks   Status New     PT LONG TERM GOAL #5   Title FOTO functional outcome score improved from 60% limitation to 49% indicating improved function with less pain   Time 8   Period Weeks   Status New               Plan - 02/14/17 1535    Clinical Impression Statement Patient needs review of her HEP due to many questions and fear of her back going into spasms.  Pateint will need education on correct body mechanics with daily activities and how to engage her core correctly.  Patient has not had change in pain yet but she is having a flare-up due to weather system coming in.  Patient will  benefit from skilled therapy to monitor her exercises to not strain her lumbar.    Rehab Potential Good   PT Frequency 2x / week   PT Duration 8 weeks   PT Next Visit Plan low level core stabilization; scapular strengthening; functional reactivation; neuroscience of pain education; lifting technique   PT Home Exercise Plan progress as needed   Consulted and Agree with Plan of Care Patient      Patient will benefit from skilled therapeutic intervention in order to improve the following deficits and impairments:  Pain, Decreased range of motion, Decreased strength  Visit Diagnosis: Chronic bilateral low back pain without sciatica  Muscle spasm of back  Muscle weakness (generalized)     Problem List There are no active problems to display for this patient.   Earlie Counts, PT 02/14/17 3:38 PM   [facility] [address], [place], [place], [address] Phone: [phone]   Fax:   [phone]  Name: Sherry Thompson MRN: [id number] Date of Birth: [birth year]

## 2017-02-19 ENCOUNTER — Ambulatory Visit: Payer: Medicare Other | Admitting: Physical Therapy

## 2017-02-19 DIAGNOSIS — M545 Low back pain, unspecified: Secondary | ICD-10-CM

## 2017-02-19 DIAGNOSIS — G8929 Other chronic pain: Secondary | ICD-10-CM

## 2017-02-19 DIAGNOSIS — M6281 Muscle weakness (generalized): Secondary | ICD-10-CM | POA: Diagnosis not present

## 2017-02-19 DIAGNOSIS — M62830 Muscle spasm of back: Secondary | ICD-10-CM

## 2017-02-19 NOTE — Therapy (Signed)
[facility] [address], [place], [place], [address] Phone: [phone]   Fax:  [phone]  Physical Therapy Treatment  Patient Details  Name: Sherry Thompson MRN: [id number] Date of Birth: September 28, 1945 Referring Provider: Dr. Maurice Small  Encounter Date: 02/19/2017      PT End of Session - 02/19/17 1543    Visit Number 5   Date for PT Re-Evaluation 03/28/17   Authorization Type  Gcodes: KX at visit 15   PT Start Time 5465   PT Stop Time 1540   PT Time Calculation (min) 51 min   Activity Tolerance Patient tolerated treatment well      Past Medical History:  Diagnosis Date  . Chronic fatigue syndrome   . Depression   . Fibromyalgia   . Osteopenia   . Psoriasis     No past surgical history on file.  There were no vitals filed for this visit.      Subjective Assessment - 02/19/17 1448    Subjective On Friday I bent over to water a plant in the morning and that aggravated my back.  Using infrared heat at home which helps.     Currently in Pain? Yes   Pain Score 6    Pain Location Back   Pain Orientation Lower   Pain Type Chronic pain   Aggravating Factors  bending, lifting   Pain Relieving Factors infrared                         [facility] Adult PT Treatment/Exercise - 02/19/17 0001      Therapeutic Activites    Other Therapeutic Activities prone lying over 1 pillow, positioning in supine with legs elevated on wedge, standing, walking, how to handle flare ups     Neuro Re-ed    Neuro Re-ed Details  transverse abdominus activation;  neuroscience of pain;  review of previous HEP;  gluteal activation     Lumbar Exercises: Stretches   Piriformis Stretch 2 reps;30 seconds   Piriformis Stretch Limitations supine     Lumbar Exercises: Aerobic   Stationary Bike Nu-Step L1 10 min     Lumbar Exercises: Standing   Other Standing Lumbar Exercises wall planks 2x5     Lumbar Exercises:  Seated   Sit to Stand Limitations self resisted hip flexion 5x     Lumbar Exercises: Supine   Ab Set 10 reps   AB Set Limitations cues for breathing   Other Supine Lumbar Exercises head press 5x     Lumbar Exercises: Sidelying   Clam 5 reps                  PT Short Term Goals - 02/14/17 1501      PT SHORT TERM GOAL #1   Title The patient will demonstrate basic understanding of postural correction, osteopenia precautions and basic HEP     Time 4   Period Weeks   Status On-going     PT SHORT TERM GOAL #2   Title The patient will report a 20% reduction in pain with usual ADLs   Baseline no improvement yet   Time 4   Period Weeks   Status On-going     PT SHORT TERM GOAL #3   Title The patient will be able to activate transverse abdominals for core stabilization without doing a pelvic tilt or Valsalva maneuver   Time 4   Period Weeks   Status On-going  PT Long Term Goals - 02/01/17 0825      PT LONG TERM GOAL #1   Title The patient will be independent in safe self progression of HEP    Time 8   Period Weeks   Target Date 03/29/17     PT LONG TERM GOAL #2   Title The patient will be able to sit for 1 hour with 2-3 breaks and pain at a manageable level in order to have lunch with her friends or family or attend book club   Time 8   Period Weeks   Status New     PT LONG TERM GOAL #3   Title The patient will demonstrate knowledge of proper body mechanics with ADLs for self management of chronic back pain and osteopenia including standing at the sink   Time 8   Period Weeks   Status New     PT LONG TERM GOAL #4   Title The patient will have core and hip strength at least 4/5 needed for household ADLs and lifting her grandson   Time 8   Period Weeks   Status New     PT LONG TERM GOAL #5   Title FOTO functional outcome score improved from 60% limitation to 49% indicating improved function with less pain   Time 8   Period Weeks   Status New                Plan - 02/19/17 1543    Clinical Impression Statement Patient continues to need moderate cues and additional instructions for coordination of breathing.  She needs close monitoring for technique and some modifications for pain.  She continues to be fearful of physical activity and therefore will need to progress her rehab very slowly to avoid an exacerbation and to facilitate a positive association with activity.     Rehab Potential Good   PT Frequency 2x / week   PT Duration 8 weeks   PT Treatment/Interventions ADLs/Self Care Home Management;Electrical Stimulation;Cryotherapy;Ultrasound;Traction;Moist Heat;Neuromuscular re-education;Therapeutic exercise;Therapeutic activities;Patient/family education;Manual techniques;Dry needling;Taping   PT Next Visit Plan low level core stabilization; scapular strengthening; functional reactivation; neuroscience of pain education; lifting technique      Patient will benefit from skilled therapeutic intervention in order to improve the following deficits and impairments:  Pain, Decreased range of motion, Decreased strength  Visit Diagnosis: Muscle spasm of back  Chronic bilateral low back pain without sciatica  Muscle weakness (generalized)     Problem List There are no active problems to display for this patient.  Ruben Im, PT 02/19/17 3:47 PM Phone: [phone] Fax: [phone]  [name] 02/19/2017, 3:46 PM  [facility] [address] [place], [place], [address] Phone: [phone]   Fax:  [phone]  Name: Sherry Thompson MRN: [id number] Date of Birth: 1945-11-28

## 2017-02-21 ENCOUNTER — Ambulatory Visit: Payer: Medicare Other | Attending: Family Medicine | Admitting: Physical Therapy

## 2017-02-21 DIAGNOSIS — G8929 Other chronic pain: Secondary | ICD-10-CM | POA: Diagnosis not present

## 2017-02-21 DIAGNOSIS — M62830 Muscle spasm of back: Secondary | ICD-10-CM | POA: Diagnosis not present

## 2017-02-21 DIAGNOSIS — M545 Low back pain, unspecified: Secondary | ICD-10-CM

## 2017-02-21 DIAGNOSIS — M6281 Muscle weakness (generalized): Secondary | ICD-10-CM | POA: Diagnosis not present

## 2017-02-21 NOTE — Therapy (Signed)
[facility] [address] [place], [place], [address] Phone: [phone]   Fax:  [phone]  Physical Therapy Treatment  Patient Details  Name: Sherry Thompson MRN: [id number] Date of Birth: 1945-10-08 Referring Provider: Dr. Maurice Small  Encounter Date: 02/21/2017      PT End of Session - 02/21/17 1634    Visit Number 6   Date for PT Re-Evaluation 03/28/17   Authorization Type  Gcodes: KX at visit 15   PT Start Time 8250   PT Stop Time 1620   PT Time Calculation (min) 50 min   Activity Tolerance Patient limited by pain      Past Medical History:  Diagnosis Date  . Chronic fatigue syndrome   . Depression   . Fibromyalgia   . Osteopenia   . Psoriasis     No past surgical history on file.  There were no vitals filed for this visit.      Subjective Assessment - 02/21/17 1536    Subjective My back is cinching up today.  I think b/c the storm is coming.  It hurts to reach into the cabinet today.     Pertinent History hx of falls;  osteopenia; fibromyalgia; depression   Currently in Pain? Yes   Pain Score 9    Pain Location Back   Pain Type Chronic pain                         [facility] Adult PT Treatment/Exercise - 02/21/17 0001      Neuro Re-ed    Neuro Re-ed Details  transverse abdominus activation;  neuroscience of pain;  review of previous HEP;  gluteal activation     Lumbar Exercises: Aerobic   Stationary Bike L1 10 min  with moist heat     Lumbar Exercises: Supine   Clam 15 reps   Other Supine Lumbar Exercises marching from bolster 10x     Lumbar Exercises: Sidelying   Clam 5 reps  bil     Moist Heat Therapy   Number Minutes Moist Heat 20 Minutes   Moist Heat Location Lumbar Spine     Electrical Stimulation   Electrical Stimulation Location Lumbar spine    Electrical Stimulation Action IFC   Electrical Stimulation Parameters bil 9 ma 20 min  part of the time with  exercise   Electrical Stimulation Goals Pain                  PT Short Term Goals - 02/14/17 1501      PT SHORT TERM GOAL #1   Title The patient will demonstrate basic understanding of postural correction, osteopenia precautions and basic HEP     Time 4   Period Weeks   Status On-going     PT SHORT TERM GOAL #2   Title The patient will report a 20% reduction in pain with usual ADLs   Baseline no improvement yet   Time 4   Period Weeks   Status On-going     PT SHORT TERM GOAL #3   Title The patient will be able to activate transverse abdominals for core stabilization without doing a pelvic tilt or Valsalva maneuver   Time 4   Period Weeks   Status On-going           PT Long Term Goals - 02/01/17 0825      PT LONG TERM GOAL #1   Title The patient will  be independent in safe self progression of HEP    Time 8   Period Weeks   Target Date 03/29/17     PT LONG TERM GOAL #2   Title The patient will be able to sit for 1 hour with 2-3 breaks and pain at a manageable level in order to have lunch with her friends or family or attend book club   Time 8   Period Weeks   Status New     PT LONG TERM GOAL #3   Title The patient will demonstrate knowledge of proper body mechanics with ADLs for self management of chronic back pain and osteopenia including standing at the sink   Time 8   Period Weeks   Status New     PT LONG TERM GOAL #4   Title The patient will have core and hip strength at least 4/5 needed for household ADLs and lifting her grandson   Time 8   Period Weeks   Status New     PT LONG TERM GOAL #5   Title FOTO functional outcome score improved from 60% limitation to 49% indicating improved function with less pain   Time 8   Period Weeks   Status New               Plan - 02/21/17 1636    Clinical Impression Statement The patient presents with complaint of severe pain and spasms.  She reports she is fearful of movement.  With  concurrent  modalities she is agreeable to very low intensity, low repetition exercise.  Following treatment session she is less guarded and even smiling.  Anticipate a slow rehab progression secondary to chronicity and fibromyalgia.     Rehab Potential Good   PT Frequency 2x / week   PT Duration 8 weeks   PT Treatment/Interventions ADLs/Self Care Home Management;Electrical Stimulation;Cryotherapy;Ultrasound;Traction;Moist Heat;Neuromuscular re-education;Therapeutic exercise;Therapeutic activities;Patient/family education;Manual techniques;Dry needling;Taping   PT Next Visit Plan low level core stabilization; scapular strengthening; functional reactivation; neuroscience of pain education; lifting technique      Patient will benefit from skilled therapeutic intervention in order to improve the following deficits and impairments:  Pain, Decreased range of motion, Decreased strength  Visit Diagnosis: Muscle spasm of back  Chronic bilateral low back pain without sciatica  Muscle weakness (generalized)     Problem List There are no active problems to display for this patient.  Ruben Im, PT 02/21/17 4:42 PM Phone: [phone] Fax: [phone]  [name] 02/21/2017, 4:41 PM  [facility] [address], [place], [place], [address] Phone: [phone]   Fax:  [phone]  Name: Sherry Thompson MRN: [id number] Date of Birth: 04-01-1946

## 2017-02-26 ENCOUNTER — Ambulatory Visit: Payer: Medicare Other | Admitting: Physical Therapy

## 2017-02-26 DIAGNOSIS — M62830 Muscle spasm of back: Secondary | ICD-10-CM | POA: Diagnosis not present

## 2017-02-26 DIAGNOSIS — M6281 Muscle weakness (generalized): Secondary | ICD-10-CM | POA: Diagnosis not present

## 2017-02-26 DIAGNOSIS — G8929 Other chronic pain: Secondary | ICD-10-CM | POA: Diagnosis not present

## 2017-02-26 DIAGNOSIS — M545 Low back pain, unspecified: Secondary | ICD-10-CM

## 2017-02-26 NOTE — Patient Instructions (Signed)
Standing on stairs with pendulum leg swing   Lying down with foot stool marching   Sit to stand 5x from high bed or bar stool   Seated chops   [facility] [address], [place], [place] [address] Phone # [phone] Fax [phone]

## 2017-02-26 NOTE — Therapy (Signed)
[facility] [address], [place], [place], [address] Phone: [phone]   Fax:  [phone]  Physical Therapy Treatment  Patient Details  Name: Sherry Thompson MRN: [id number] Date of Birth: 11/21/1945 Referring Provider: Dr. Maurice Small   Encounter Date: 02/26/2017  PT End of Session - 02/26/17 1516    Visit Number  7    Date for PT Re-Evaluation  03/28/17    Authorization Type   Gcodes: KX at visit 15    PT Start Time  1450    PT Stop Time  1530    PT Time Calculation (min)  40 min    Activity Tolerance  Patient limited by pain       Past Medical History:  Diagnosis Date  . Chronic fatigue syndrome   . Depression   . Fibromyalgia   . Osteopenia   . Psoriasis     No past surgical history on file.  There were no vitals filed for this visit.  Subjective Assessment - 02/26/17 1450    Subjective  This (rainy) weather is not good for me.   On Sunday, I lifted wet bags of leaves and then the next morning, I could tell I did something.  Used the TENS and heat Monday.  The fibromyalgia component is a huge part of this.      Pertinent History  hx of falls;  osteopenia; fibromyalgia; depression    Currently in Pain?  Yes    Pain Score  7     Pain Location  Back    Pain Type  Chronic pain    Aggravating Factors   weather, bending, lifting     Pain Relieving Factors  heat, TENs, infrared                      [facility] Adult PT Treatment/Exercise - 02/26/17 0001      Neuro Re-ed    Neuro Re-ed Details   transverse abdominus activation;  neuroscience of pain;  review of previous HEP;  gluteal activation      Lumbar Exercises: Aerobic   Stationary Bike  Nu-Step L1 seat 7 with moist heat   with moist heat     Lumbar Exercises: Standing   Other Standing Lumbar Exercises  leg swings at the stairs 30 sec bil      Lumbar Exercises: Seated   Other Seated Lumbar Exercises  sit to stand 2x5      Lumbar  Exercises: Supine   Clam  15 reps    Other Supine Lumbar Exercises  marching from bolster 10x      Moist Heat Therapy   Number Minutes Moist Heat  19 Minutes    Moist Heat Location  Lumbar Spine      Electrical Stimulation   Electrical Stimulation Location  Lumbar spine     Electrical Stimulation Action  IFC    Electrical Stimulation Parameters  bil 9 ma 19 min concurrent with ex   concurrent with ex   Electrical Stimulation Goals  Pain       hand to opposite knee 5x from wedge.     Seated chops 10x    Written HEP given.   PT Short Term Goals - 02/14/17 1501      PT SHORT TERM GOAL #1   Title  The patient will demonstrate basic understanding of postural correction, osteopenia precautions and basic HEP      Time  4  Period  Weeks    Status  On-going      PT SHORT TERM GOAL #2   Title  The patient will report a 20% reduction in pain with usual ADLs    Baseline  no improvement yet    Time  4    Period  Weeks    Status  On-going      PT SHORT TERM GOAL #3   Title  The patient will be able to activate transverse abdominals for core stabilization without doing a pelvic tilt or Valsalva maneuver    Time  4    Period  Weeks    Status  On-going        PT Long Term Goals - 02/01/17 0825      PT LONG TERM GOAL #1   Title  The patient will be independent in safe self progression of HEP     Time  8    Period  Weeks    Target Date  03/29/17      PT LONG TERM GOAL #2   Title  The patient will be able to sit for 1 hour with 2-3 breaks and pain at a manageable level in order to have lunch with her friends or family or attend book club    Time  8    Period  Weeks    Status  New      PT LONG TERM GOAL #3   Title  The patient will demonstrate knowledge of proper body mechanics with ADLs for self management of chronic back pain and osteopenia including standing at the sink    Time  8    Period  Weeks    Status  New      PT LONG TERM GOAL #4   Title  The patient will  have core and hip strength at least 4/5 needed for household ADLs and lifting her grandson    Time  8    Period  Weeks    Status  New      PT LONG TERM GOAL #5   Title  FOTO functional outcome score improved from 60% limitation to 49% indicating improved function with less pain    Time  8    Period  Weeks    Status  New            Plan - 02/26/17 1516    Clinical Impression Statement  Fibromyalgia continues to be a limiting factor.   Modalities concurrent with ex and low repetitions are helpful.      Rehab Potential  Good    PT Frequency  2x / week    PT Duration  8 weeks    PT Treatment/Interventions  ADLs/Self Care Home Management;Electrical Stimulation;Cryotherapy;Ultrasound;Traction;Moist Heat;Neuromuscular re-education;Therapeutic exercise;Therapeutic activities;Patient/family education;Manual techniques;Dry needling;Taping    PT Next Visit Plan  low level core stabilization; scapular strengthening; functional reactivation; neuroscience of pain education; quad strengthening       Patient will benefit from skilled therapeutic intervention in order to improve the following deficits and impairments:  Pain, Decreased range of motion, Decreased strength  Visit Diagnosis: Muscle spasm of back  Chronic bilateral low back pain without sciatica  Muscle weakness (generalized)     Problem List There are no active problems to display for this patient.  Ruben Im, PT 02/26/17 3:25 PM Phone: [phone] Fax: [phone]  [name] 02/26/2017, 3:22 PM  [facility] [address], [place], [place], [address] Phone:  [phone]   Fax:  [phone]  Name: Sherry Thompson MRN: [id number] Date of Birth: October 16, 1945

## 2017-02-28 ENCOUNTER — Ambulatory Visit: Payer: Medicare Other | Admitting: Physical Therapy

## 2017-02-28 ENCOUNTER — Encounter: Payer: Self-pay | Admitting: Physical Therapy

## 2017-02-28 DIAGNOSIS — G8929 Other chronic pain: Secondary | ICD-10-CM | POA: Diagnosis not present

## 2017-02-28 DIAGNOSIS — M545 Low back pain: Secondary | ICD-10-CM | POA: Diagnosis not present

## 2017-02-28 DIAGNOSIS — M6281 Muscle weakness (generalized): Secondary | ICD-10-CM

## 2017-02-28 DIAGNOSIS — M62830 Muscle spasm of back: Secondary | ICD-10-CM

## 2017-02-28 NOTE — Therapy (Signed)
[facility] [address], [place], [place], [address] Phone: [phone]   Fax:  [phone]  Physical Therapy Treatment  Patient Details  Name: Sherry Thompson MRN: [id number] Date of Birth: [birth year] Referring Provider: Dr. Maurice Small   Encounter Date: 02/28/2017  PT End of Session - 02/28/17 1504    Visit Number  8    Date for PT Re-Evaluation  03/28/17    Authorization Type   Gcodes: KX at visit 15    PT Start Time  4128    PT Stop Time  1532    PT Time Calculation (min)  38 min    Activity Tolerance  Patient tolerated treatment well       Past Medical History:  Diagnosis Date  . Chronic fatigue syndrome   . Depression   . Fibromyalgia   . Osteopenia   . Psoriasis     History reviewed. No pertinent surgical history.  There were no vitals filed for this visit.  Subjective Assessment - 02/28/17 1453    Subjective  This weather is hard on me.  I could tell my thigh muscles were tired after last time when I walked the dog uphill.      Pertinent History  hx of falls;  osteopenia; fibromyalgia; depression    Currently in Pain?  Yes    Pain Score  6     Pain Location  Back    Pain Type  Chronic pain    Pain Frequency  Constant    Aggravating Factors   weather    Pain Relieving Factors  a little stretching                      [facility] Adult PT Treatment/Exercise - 02/28/17 0001      Therapeutic Activites    Other Therapeutic Activities  log rolling, activity modification to apply skin cream on back for psoriasis without twisting      Neuro Re-ed    Neuro Re-ed Details   transverse abdominus activation;  neuroscience of pain;  review of previous HEP;  gluteal activation      Lumbar Exercises: Stretches   Hip Flexor Stretch  -- doorway psoas stretch; hold on UE reach overs painful      Lumbar Exercises: Aerobic   Stationary Bike  Nu-Step L1 seat 7 arms 10 with moist heat      Lumbar  Exercises: Standing   Other Standing Lumbar Exercises  wall planks 8x    Other Standing Lumbar Exercises  leg swings at the stairs 30 sec bil      Lumbar Exercises: Seated   Other Seated Lumbar Exercises  sit to stand 5x      Lumbar Exercises: Supine   Other Supine Lumbar Exercises  marching from bolster 10x      Moist Heat Therapy   Number Minutes Moist Heat  20 Minutes    Moist Heat Location  Lumbar Spine      Electrical Stimulation   Electrical Stimulation Location  Lumbar spine     Electrical Stimulation Action  IFC    Electrical Stimulation Parameters  11 ma 15 min concurrent  With ex   Electrical Stimulation Goals  Pain               PT Short Term Goals - 02/28/17 1533      PT SHORT TERM GOAL #1   Title  The patient will demonstrate basic understanding of postural  correction, osteopenia precautions and basic HEP      Status  Achieved      PT SHORT TERM GOAL #2   Title  The patient will report a 20% reduction in pain with usual ADLs    Time  4    Period  Weeks    Status  On-going      PT SHORT TERM GOAL #3   Title  The patient will be able to activate transverse abdominals for core stabilization without doing a pelvic tilt or Valsalva maneuver    Status  Achieved        PT Long Term Goals - 02/01/17 0825      PT LONG TERM GOAL #1   Title  The patient will be independent in safe self progression of HEP     Time  8    Period  Weeks    Target Date  03/29/17      PT LONG TERM GOAL #2   Title  The patient will be able to sit for 1 hour with 2-3 breaks and pain at a manageable level in order to have lunch with her friends or family or attend book club    Time  8    Period  Weeks    Status  New      PT LONG TERM GOAL #3   Title  The patient will demonstrate knowledge of proper body mechanics with ADLs for self management of chronic back pain and osteopenia including standing at the sink    Time  8    Period  Weeks    Status  New      PT LONG TERM  GOAL #4   Title  The patient will have core and hip strength at least 4/5 needed for household ADLs and lifting her grandson    Time  8    Period  Weeks    Status  New      PT LONG TERM GOAL #5   Title  FOTO functional outcome score improved from 60% limitation to 49% indicating improved function with less pain    Time  8    Period  Weeks    Status  New            Plan - 02/28/17 1528    Clinical Impression Statement  The patient is able to participate in low level standing, sitting and supine exercises with use of moist heat and electrical stimulation during part of exercises.  Still some fearfulness of physical activity.  Slower progress toward goals secondary to chronicity of pain.  Partial STGs met.      Rehab Potential  Good    PT Frequency  2x / week    PT Duration  8 weeks    PT Treatment/Interventions  ADLs/Self Care Home Management;Electrical Stimulation;Cryotherapy;Ultrasound;Traction;Moist Heat;Neuromuscular re-education;Therapeutic exercise;Therapeutic activities;Patient/family education;Manual techniques;Dry needling;Taping    PT Next Visit Plan  Check % improvement for STG;  low level core stabilization; scapular strengthening; functional reactivation; neuroscience of pain education; quad strengthening       Patient will benefit from skilled therapeutic intervention in order to improve the following deficits and impairments:  Pain, Decreased range of motion, Decreased strength  Visit Diagnosis: Muscle spasm of back  Chronic bilateral low back pain without sciatica  Muscle weakness (generalized)     Problem List There are no active problems to display for this patient.  Ruben Im, PT 02/28/17 4:15 PM Phone: [phone] Fax: [phone]  [name]  02/28/2017, 4:14 PM  [facility] [address] [place], [place], [address] Phone: [phone]   Fax:  [phone]  Name: CHEVONNE BOSTROM MRN: [id number] Date of Birth: 1945/07/23

## 2017-03-05 ENCOUNTER — Ambulatory Visit: Payer: Medicare Other | Admitting: Physical Therapy

## 2017-03-05 DIAGNOSIS — M6281 Muscle weakness (generalized): Secondary | ICD-10-CM

## 2017-03-05 DIAGNOSIS — M545 Low back pain: Secondary | ICD-10-CM

## 2017-03-05 DIAGNOSIS — G8929 Other chronic pain: Secondary | ICD-10-CM

## 2017-03-05 DIAGNOSIS — M62830 Muscle spasm of back: Secondary | ICD-10-CM | POA: Diagnosis not present

## 2017-03-05 NOTE — Patient Instructions (Signed)
    Abduction: Clam (Eccentric) - Side-Lying   Lie on side with knees bent. Lift top knee, keeping feet together. Keep trunk steady. Slowly lower for 3-5 seconds. _8-10__ reps per set, _1__ sets per day, __5-7_ days per week.   Copyright  VHI. All rights reserved.        Ruben Im PT [facility] [address] [place], [place] [address] Phone # [phone] Fax [phone]

## 2017-03-05 NOTE — Therapy (Signed)
[facility] [address], [place], [place], [address] Phone: [phone]   Fax:  [phone]  Physical Therapy Treatment  Patient Details  Name: Sherry Thompson MRN: [id number] Date of Birth: 06-28-1945 Referring Provider: Dr. Maurice Small   Encounter Date: 03/05/2017  PT End of Session - 03/05/17 1517    Visit Number  9    Date for PT Re-Evaluation  03/28/17    Authorization Type   Gcodes: KX at visit 15    PT Start Time  3016 patient late    PT Stop Time  1530    PT Time Calculation (min)  38 min    Activity Tolerance  Patient tolerated treatment well       Past Medical History:  Diagnosis Date  . Chronic fatigue syndrome   . Depression   . Fibromyalgia   . Osteopenia   . Psoriasis     No past surgical history on file.  There were no vitals filed for this visit.  Subjective Assessment - 03/05/17 1452    Subjective  This weather is not good for me.  I was doing pretty good until 15 min ago when my mid back cinched up.  I took 1/2 valium earlier.  I have some questions about the exercises.      Currently in Pain?  Yes    Pain Score  6     Pain Location  Back    Pain Orientation  Mid;Lower    Pain Type  Chronic pain    Aggravating Factors   weather    Pain Relieving Factors  heat                      [facility] Adult PT Treatment/Exercise - 03/05/17 0001      Neuro Re-ed    Neuro Re-ed Details   transverse abdominus activation;  neuroscience of pain;  review of previous HEP;  gluteal activation      Lumbar Exercises: Stretches   Active Hamstring Stretch  2 reps;30 seconds    Active Hamstring Stretch Limitations  supine with heat/electrical stimulation    Piriformis Stretch  2 reps;30 seconds    Piriformis Stretch Limitations  supine with heat and stim      Lumbar Exercises: Aerobic   Stationary Bike  Nu-Step L1 seat 7 arms 10 with moist heat      Lumbar Exercises: Standing   Other  Standing Lumbar Exercises  --      Lumbar Exercises: Seated   Other Seated Lumbar Exercises  --    Other Seated Lumbar Exercises  review of previous HEP      Lumbar Exercises: Supine   AB Set Limitations  chops 10x bil concurrent with heat and electrical stimulation    Other Supine Lumbar Exercises  marching from bolster 10x    Other Supine Lumbar Exercises  isometric pushes 5x      Lumbar Exercises: Sidelying   Clam  -- bil      Moist Heat Therapy   Number Minutes Moist Heat  20 Minutes    Moist Heat Location  Lumbar Spine      Electrical Stimulation   Electrical Stimulation Location  Lumbar spine     Electrical Stimulation Action  IFC    Electrical Stimulation Parameters  8 ma 20 min part of time concurrent with ex   Electrical Stimulation Goals  Pain  PT Education - 03/05/17 1516    Education provided  Yes    Education Details  clams, door way rocks        PT Short Term Goals - 03/05/17 1722      PT SHORT TERM GOAL #1   Title  The patient will demonstrate basic understanding of postural correction, osteopenia precautions and basic HEP      Status  Achieved      PT SHORT TERM GOAL #2   Title  The patient will report a 20% reduction in pain with usual ADLs    Time  4    Period  Weeks    Status  On-going      PT SHORT TERM GOAL #3   Title  The patient will be able to activate transverse abdominals for core stabilization without doing a pelvic tilt or Valsalva maneuver    Status  Achieved        PT Long Term Goals - 02/01/17 0825      PT LONG TERM GOAL #1   Title  The patient will be independent in safe self progression of HEP     Time  8    Period  Weeks    Target Date  03/29/17      PT LONG TERM GOAL #2   Title  The patient will be able to sit for 1 hour with 2-3 breaks and pain at a manageable level in order to have lunch with her friends or family or attend book club    Time  8    Period  Weeks    Status  New      PT LONG TERM GOAL  #3   Title  The patient will demonstrate knowledge of proper body mechanics with ADLs for self management of chronic back pain and osteopenia including standing at the sink    Time  8    Period  Weeks    Status  New      PT LONG TERM GOAL #4   Title  The patient will have core and hip strength at least 4/5 needed for household ADLs and lifting her grandson    Time  8    Period  Weeks    Status  New      PT LONG TERM GOAL #5   Title  FOTO functional outcome score improved from 60% limitation to 49% indicating improved function with less pain    Time  8    Period  Weeks    Status  New            Plan - 03/05/17 1517    Clinical Impression Statement  Continue to progress slowly secondary to high pain sensitivity.  Still fearful of physical activity.  Able to perform low level mobility and core exercises with moist heat and electrical stimulation concurrently.  Anticipate 5-6 more visits, then recommend discharge to a HEP.      PT Frequency  2x / week    PT Duration  8 weeks    PT Treatment/Interventions  ADLs/Self Care Home Management;Electrical Stimulation;Cryotherapy;Ultrasound;Traction;Moist Heat;Neuromuscular re-education;Therapeutic exercise;Therapeutic activities;Patient/family education;Manual techniques;Dry needling;Taping    PT Next Visit Plan   Gcode next visit;  Check % improvement for STG;  low level core stabilization; scapular strengthening; functional reactivation; neuroscience of pain education; quad strengthening       Patient will benefit from skilled therapeutic intervention in order to improve the following deficits and impairments:  Pain, Decreased range of motion,  Decreased strength  Visit Diagnosis: Muscle spasm of back  Chronic bilateral low back pain without sciatica  Muscle weakness (generalized)     Problem List There are no active problems to display for this patient.  Ruben Im, PT 03/05/17 5:24 PM Phone: [phone] Fax:  [phone]  [name] 03/05/2017, 5:23 PM   [facility] [address], [place], [place], [address] Phone: [phone]   Fax:  [phone]  Name: Sherry Thompson MRN: [id number] Date of Birth: January 19, 1946

## 2017-03-07 ENCOUNTER — Ambulatory Visit: Payer: Medicare Other | Admitting: Physical Therapy

## 2017-03-07 ENCOUNTER — Encounter: Payer: Self-pay | Admitting: Physical Therapy

## 2017-03-07 DIAGNOSIS — M6281 Muscle weakness (generalized): Secondary | ICD-10-CM | POA: Diagnosis not present

## 2017-03-07 DIAGNOSIS — M545 Low back pain, unspecified: Secondary | ICD-10-CM

## 2017-03-07 DIAGNOSIS — G8929 Other chronic pain: Secondary | ICD-10-CM | POA: Diagnosis not present

## 2017-03-07 DIAGNOSIS — M62830 Muscle spasm of back: Secondary | ICD-10-CM

## 2017-03-07 NOTE — Therapy (Signed)
[facility] [address], [place], [place], [address] Phone: [phone]   Fax:  [phone]  Physical Therapy Treatment  Patient Details  Name: Sherry Thompson MRN: [id number] Date of Birth: 1945/06/29 Referring Provider: Dr. Maurice Small   Encounter Date: 03/07/2017  PT End of Session - 03/07/17 1520    Visit Number  10    Date for PT Re-Evaluation  03/28/17    Authorization Type   Gcodes on visit 20: KX at visit 15    PT Start Time  1449 came late    PT Stop Time  1540    PT Time Calculation (min)  51 min    Activity Tolerance  Patient tolerated treatment well;Patient limited by pain    Behavior During Therapy  [facility] for tasks assessed/performed       Past Medical History:  Diagnosis Date  . Chronic fatigue syndrome   . Depression   . Fibromyalgia   . Osteopenia   . Psoriasis     History reviewed. No pertinent surgical history.  There were no vitals filed for this visit.  Subjective Assessment - 03/07/17 1500    Subjective  today I feel like my back will cinch up due to the weather. I am able to do just little today.     Pertinent History  hx of falls;  osteopenia; fibromyalgia; depression    Limitations  House hold activities;Walking;Lifting;Sitting    How long can you sit comfortably?  30 min    How long can you walk comfortably?  30 min with walking the dog    Diagnostic tests  MRI mild degenerative changes mid thoracic and lower cervical     Patient Stated Goals  decrease pain    Currently in Pain?  Yes    Pain Score  8     Pain Location  Back    Pain Orientation  Lower;Mid    Pain Descriptors / Indicators  Aching    Pain Type  Chronic pain    Pain Onset  More than a month ago    Pain Frequency  Constant    Aggravating Factors   weather    Pain Relieving Factors  heat    Multiple Pain Sites  No         [facility] PT Assessment - 03/07/17 0001      Assessment   Medical Diagnosis  acute midline  low back pain without sciatica    Referring Provider  Dr. Maurice Small    Onset Date/Surgical Date  -- >6 months    Hand Dominance  Right    Next MD Visit  annual wellness    Prior Therapy  Dec 2017      Precautions   Precautions  None      Restrictions   Weight Bearing Restrictions  No      Home Environment   Living Environment  Private residence    Living Arrangements  Alone    Available Help at Discharge  Friend(s)    Type of [place] to enter    Entrance Stairs-Number of Steps  3    Home Layout  Two level;Able to live on main level with bedroom/bathroom      Prior Function   Level of Independence  Independent with basic ADLs    Vocation  Retired    Leisure  nothing I hurt to much      Observation/Other Assessments  Focus on Therapeutic Outcomes (FOTO)   60% limitation due to weather       Posture/Postural Control   Posture/Postural Control  Postural limitations    Postural Limitations  Rounded Shoulders;Forward head;Increased thoracic kyphosis      Strength   Right Hip Flexion  5/5    Left Hip Flexion  5/5                  [facility] Adult PT Treatment/Exercise - 03/07/17 0001      Lumbar Exercises: Stretches   Active Hamstring Stretch  2 reps;30 seconds    Active Hamstring Stretch Limitations  supine with heat/electrical stimulation    Piriformis Stretch  2 reps;30 seconds    Piriformis Stretch Limitations  supine with heat and stim      Lumbar Exercises: Aerobic   Stationary Bike  Nu-Step L1 seat 7 arms 10 min with moist heat; while assessing patient      Lumbar Exercises: Supine   AB Set Limitations  chops 10x bil concurrent with heat and electrical stimulation    Other Supine Lumbar Exercises  marching from bolster 10x vc to perform abdominal brace    Other Supine Lumbar Exercises  isometric pushes 5x      Modalities   Modalities  Moist Heat      Moist Heat Therapy   Number Minutes Moist Heat  20 Minutes    Moist Heat  Location  Lumbar Spine      Electrical Stimulation   Electrical Stimulation Location  Lumbar spine     Electrical Stimulation Action  IFC    Electrical Stimulation Parameters  to patient tolerance, 20 min    Electrical Stimulation Goals  Pain               PT Short Term Goals - 03/07/17 1508      PT SHORT TERM GOAL #2   Title  The patient will report a 20% reduction in pain with usual ADLs    Baseline  no improvement yet due to recent flare up from the weather    Time  4    Period  Weeks    Status  On-going        PT Long Term Goals - 02/01/17 0825      PT LONG TERM GOAL #1   Title  The patient will be independent in safe self progression of HEP     Time  8    Period  Weeks    Target Date  03/29/17      PT LONG TERM GOAL #2   Title  The patient will be able to sit for 1 hour with 2-3 breaks and pain at a manageable level in order to have lunch with her friends or family or attend book club    Time  8    Period  Weeks    Status  New      PT LONG TERM GOAL #3   Title  The patient will demonstrate knowledge of proper body mechanics with ADLs for self management of chronic back pain and osteopenia including standing at the sink    Time  8    Period  Weeks    Status  New      PT LONG TERM GOAL #4   Title  The patient will have core and hip strength at least 4/5 needed for household ADLs and lifting her grandson    Time  8    Period  Weeks  Status  New      PT LONG TERM GOAL #5   Title  FOTO functional outcome score improved from 60% limitation to 49% indicating improved function with less pain    Time  8    Period  Weeks    Status  New            Plan - 2017-03-10 1522    Clinical Impression Statement  Patient continue to move slowly due to pain and weather affecting her fibromyalgia.  Patient does better with exercise with heat on back and electrical stimulation. Patient needs verbal cues to perform abdominal bracing with exercise to protect her back.  Patient will benefit from skilled therapy to improve muscle coordination while monitoring pain. Anticipate 5 more visits , then recommend discharge to a HEP.     Rehab Potential  Good    PT Frequency  2x / week    PT Duration  8 weeks    PT Treatment/Interventions  ADLs/Self Care Home Management;Electrical Stimulation;Cryotherapy;Ultrasound;Traction;Moist Heat;Neuromuscular re-education;Therapeutic exercise;Therapeutic activities;Patient/family education;Manual techniques;Dry needling;Taping    PT Next Visit Plan   Check % improvement for STG;  low level core stabilization; scapular strengthening; functional reactivation; neuroscience of pain education; quad strengthening    PT Home Exercise Plan  progress as needed    Consulted and Agree with Plan of Care  Patient       Patient will benefit from skilled therapeutic intervention in order to improve the following deficits and impairments:  Pain, Decreased range of motion, Decreased strength  Visit Diagnosis: Muscle spasm of back  Chronic bilateral low back pain without sciatica  Muscle weakness (generalized)   G-Codes - 2017-03-10 1527    Functional Assessment Tool Used (Outpatient Only)  Clinical impressions; FOTO    Functional Limitation  Mobility: Walking and moving around    Mobility: Walking and Moving Around Current Status (X5170)  At least 60 percent but less than 80 percent impaired, limited or restricted    Mobility: Walking and Moving Around Goal Status (Y1749)  At least 40 percent but less than 60 percent impaired, limited or restricted       Problem List There are no active problems to display for this patient.   Earlie Counts, PT 2017-03-10 3:29 PM   [facility] [address] [place], [place], [address] Phone: [phone]   Fax:  [phone]  Name: Sherry Thompson MRN: [id number] Date of Birth: Dec 13, 1945

## 2017-03-12 ENCOUNTER — Ambulatory Visit: Payer: Medicare Other | Admitting: Physical Therapy

## 2017-03-12 DIAGNOSIS — G8929 Other chronic pain: Secondary | ICD-10-CM

## 2017-03-12 DIAGNOSIS — M6281 Muscle weakness (generalized): Secondary | ICD-10-CM | POA: Diagnosis not present

## 2017-03-12 DIAGNOSIS — M62830 Muscle spasm of back: Secondary | ICD-10-CM | POA: Diagnosis not present

## 2017-03-12 DIAGNOSIS — M545 Low back pain: Secondary | ICD-10-CM | POA: Diagnosis not present

## 2017-03-12 NOTE — Therapy (Signed)
[facility] [address], [place], [place], [address] Phone: [phone]   Fax:  [phone]  Physical Therapy Treatment  Patient Details  Name: Sherry Thompson MRN: [id number] Date of Birth: [birth year] Referring Provider: Dr. Maurice Small   Encounter Date: 03/12/2017  PT End of Session - 03/12/17 1503    Visit Number  11    Date for PT Re-Evaluation  03/28/17    Authorization Type   Gcodes on visit 20: KX at visit 15    PT Start Time  1453    PT Stop Time  1531    PT Time Calculation (min)  38 min    Activity Tolerance  Patient tolerated treatment well       Past Medical History:  Diagnosis Date  . Chronic fatigue syndrome   . Depression   . Fibromyalgia   . Osteopenia   . Psoriasis     No past surgical history on file.  There were no vitals filed for this visit.  Subjective Assessment - 03/12/17 1457    Subjective  Reports she is upset about family/friend issues with neighborhood child.  Lot of stress affecting sleep.  "It's no wonder my back hurts."     Pertinent History  hx of falls;  osteopenia; fibromyalgia; depression    Currently in Pain?  Yes    Pain Score  5     Pain Location  Back    Pain Type  Chronic pain    Aggravating Factors   weather, stress    Pain Relieving Factors  heat; TENS                      [facility] Adult PT Treatment/Exercise - 03/12/17 0001      Therapeutic Activites    Other Therapeutic Activities  standing, walking, transitional movements      Neuro Re-ed    Neuro Re-ed Details   transverse abdominus activation;  neuroscience of pain;  review of previous HEP;  gluteal activation      Lumbar Exercises: Stretches   Active Hamstring Stretch Limitations  2nd step stretch rocks 5x    Hip Flexor Stretch  -- psoas doorway strech 2x5 no reach over    Piriformis Stretch  2 reps;30 seconds    Piriformis Stretch Limitations  supine with heat and stim      Lumbar  Exercises: Aerobic   Stationary Bike  Nu-Step L1 seat 7 arms 10 min with moist heat; while assessing patient      Lumbar Exercises: Standing   Other Standing Lumbar Exercises  wall planks 8x    Other Standing Lumbar Exercises  leg swings at the stairs 30 sec bil      Lumbar Exercises: Seated   Other Seated Lumbar Exercises  sit to stand 5x      Lumbar Exercises: Supine   AB Set Limitations  ball squeeze 15x    Clam  15 reps double and single    Other Supine Lumbar Exercises  green band hip extension 5x right/left    Other Supine Lumbar Exercises  isometric pushes 5x      Moist Heat Therapy   Number Minutes Moist Heat  20 Minutes    Moist Heat Location  Lumbar Spine with heat               PT Short Term Goals - 03/12/17 1629      PT SHORT TERM GOAL #1   Title  The patient will demonstrate basic understanding of postural correction, osteopenia precautions and basic HEP      Status  Achieved      PT SHORT TERM GOAL #2   Title  The patient will report a 20% reduction in pain with usual ADLs    Time  4    Period  Weeks    Status  On-going      PT SHORT TERM GOAL #3   Title  The patient will be able to activate transverse abdominals for core stabilization without doing a pelvic tilt or Valsalva maneuver    Status  Achieved        PT Long Term Goals - 02/01/17 0825      PT LONG TERM GOAL #1   Title  The patient will be independent in safe self progression of HEP     Time  8    Period  Weeks    Target Date  03/29/17      PT LONG TERM GOAL #2   Title  The patient will be able to sit for 1 hour with 2-3 breaks and pain at a manageable level in order to have lunch with her friends or family or attend book club    Time  8    Period  Weeks    Status  New      PT LONG TERM GOAL #3   Title  The patient will demonstrate knowledge of proper body mechanics with ADLs for self management of chronic back pain and osteopenia including standing at the sink    Time  8     Period  Weeks    Status  New      PT LONG TERM GOAL #4   Title  The patient will have core and hip strength at least 4/5 needed for household ADLs and lifting her grandson    Time  8    Period  Weeks    Status  New      PT LONG TERM GOAL #5   Title  FOTO functional outcome score improved from 60% limitation to 49% indicating improved function with less pain    Time  8    Period  Weeks    Status  New            Plan - 03/12/17 1524    Clinical Impression Statement  Slow progression continues to be necessary secondary to chronic pain and fibromyalgia.  Too much activity exacerbates her pain level.  Will continue to establish an independent HEP to prepare for discharge in 2 weeks.      Rehab Potential  Good    PT Frequency  2x / week    PT Duration  8 weeks    PT Treatment/Interventions  ADLs/Self Care Home Management;Electrical Stimulation;Cryotherapy;Ultrasound;Traction;Moist Heat;Neuromuscular re-education;Therapeutic exercise;Therapeutic activities;Patient/family education;Manual techniques;Dry needling;Taping    PT Next Visit Plan    low level core stabilization; scapular strengthening; functional reactivation; neuroscience of pain education; quad strengthening       Patient will benefit from skilled therapeutic intervention in order to improve the following deficits and impairments:  Pain, Decreased range of motion, Decreased strength  Visit Diagnosis: Muscle spasm of back  Chronic bilateral low back pain without sciatica  Muscle weakness (generalized)     Problem List There are no active problems to display for this patient.  Ruben Im, PT 03/12/17 4:31 PM Phone: [phone] Fax: [phone]  [name] 03/12/2017, 4:31 PM  [facility] [address] [place], [place], [address] Phone: [phone]   Fax:  [phone]  Name: Sherry Thompson MRN: [id number] Date of Birth:  [birth year]

## 2017-03-19 ENCOUNTER — Ambulatory Visit: Payer: Medicare Other | Admitting: Physical Therapy

## 2017-03-19 DIAGNOSIS — G8929 Other chronic pain: Secondary | ICD-10-CM

## 2017-03-19 DIAGNOSIS — M62830 Muscle spasm of back: Secondary | ICD-10-CM

## 2017-03-19 DIAGNOSIS — M545 Low back pain: Secondary | ICD-10-CM

## 2017-03-19 DIAGNOSIS — M6281 Muscle weakness (generalized): Secondary | ICD-10-CM

## 2017-03-19 NOTE — Therapy (Signed)
[facility] [address] [place], [place], [address] Phone: [phone]   Fax:  [phone]  Physical Therapy Treatment  Patient Details  Name: Sherry Thompson MRN: [id number] Date of Birth: June 20, 1945 Referring Provider: Dr. Maurice Small   Encounter Date: 03/19/2017  PT End of Session - 03/19/17 1507    Visit Number  12    Date for PT Re-Evaluation  03/28/17    Authorization Type   Gcodes on visit 20: KX at visit 15    PT Start Time  1451    PT Stop Time  1530    PT Time Calculation (min)  39 min    Activity Tolerance  Patient tolerated treatment well       Past Medical History:  Diagnosis Date  . Chronic fatigue syndrome   . Depression   . Fibromyalgia   . Osteopenia   . Psoriasis     No past surgical history on file.  There were no vitals filed for this visit.  Subjective Assessment - 03/19/17 1454    Subjective  Today is not a good day.  I've tried to go off my Valium and I've only slept for 3 hours.  My pain is so intense I'm afraid to move today.  I almost didn't come.  My low back is the worst but I'm hurting all over today.      Currently in Pain?  Yes    Pain Score  9     Pain Location  Back    Pain Orientation  Lower    Pain Type  Chronic pain    Aggravating Factors   weather, stress    Pain Relieving Factors  heat, medication                      [facility] Adult PT Treatment/Exercise - 03/19/17 0001      Neuro Re-ed    Neuro Re-ed Details   neuroscience of pain reeducation      Lumbar Exercises: Aerobic   Stationary Bike  Nu-Step L1 seat 7 arms 8 min with moist heat; while assessing patient;decreased time       Lumbar Exercises: Supine   AB Set Limitations  ball squeeze 15x    Clam  15 reps    Other Supine Lumbar Exercises  marching from bolster 10x      Moist Heat Therapy   Number Minutes Moist Heat  20 Minutes    Moist Heat Location  Lumbar Spine with heat      Electrical Stimulation   Electrical Stimulation Location  Lumbar spine     Electrical Stimulation Action  IFC    Electrical Stimulation Parameters  10 ma 20 min part of time concurrent with ex    Electrical Stimulation Goals  Pain               PT Short Term Goals - 03/12/17 1629      PT SHORT TERM GOAL #1   Title  The patient will demonstrate basic understanding of postural correction, osteopenia precautions and basic HEP      Status  Achieved      PT SHORT TERM GOAL #2   Title  The patient will report a 20% reduction in pain with usual ADLs    Time  4    Period  Weeks    Status  On-going      PT SHORT TERM GOAL #3   Title  The  patient will be able to activate transverse abdominals for core stabilization without doing a pelvic tilt or Valsalva maneuver    Status  Achieved        PT Long Term Goals - 02/01/17 0825      PT LONG TERM GOAL #1   Title  The patient will be independent in safe self progression of HEP     Time  8    Period  Weeks    Target Date  03/29/17      PT LONG TERM GOAL #2   Title  The patient will be able to sit for 1 hour with 2-3 breaks and pain at a manageable level in order to have lunch with her friends or family or attend book club    Time  8    Period  Weeks    Status  New      PT LONG TERM GOAL #3   Title  The patient will demonstrate knowledge of proper body mechanics with ADLs for self management of chronic back pain and osteopenia including standing at the sink    Time  8    Period  Weeks    Status  New      PT LONG TERM GOAL #4   Title  The patient will have core and hip strength at least 4/5 needed for household ADLs and lifting her grandson    Time  8    Period  Weeks    Status  New      PT LONG TERM GOAL #5   Title  FOTO functional outcome score improved from 60% limitation to 49% indicating improved function with less pain    Time  8    Period  Weeks    Status  New            Plan - 03/19/17 1523    Clinical  Impression Statement  Will continue to progress slowly secondary to chronic pain, fibromyalgia and frequent flare ups.  Long discussion of neuroscience of pain and the brain's role.  Despite modalities and low intensity exercise, she states "I still hurt" and rates her pain still at a 9/10.  She may be approaching max potential with PT at this time.  Discussed with patient that we continue 2-3 more sessions and then discharge to her a basic ex program at that time.      Rehab Potential  Good    PT Frequency  2x / week    PT Duration  8 weeks    PT Treatment/Interventions  ADLs/Self Care Home Management;Electrical Stimulation;Cryotherapy;Ultrasound;Traction;Moist Heat;Neuromuscular re-education;Therapeutic exercise;Therapeutic activities;Patient/family education;Manual techniques;Dry needling;Taping    PT Next Visit Plan    low level core stabilization; scapular strengthening; functional reactivation; neuroscience of pain education; quad strengthening  Modalities as needed      Patient will benefit from skilled therapeutic intervention in order to improve the following deficits and impairments:  Pain, Decreased range of motion, Decreased strength  Visit Diagnosis: Muscle spasm of back  Chronic bilateral low back pain without sciatica  Muscle weakness (generalized)     Problem List There are no active problems to display for this patient.  Ruben Im, PT 03/19/17 4:14 PM Phone: [phone] Fax: [phone]  [name] 03/19/2017, 4:06 PM  [facility] [address], [place], [place], [address] Phone: [phone]   Fax:  [phone]  Name: Sherry Thompson MRN: [id number] Date of Birth: [birth year]

## 2017-03-21 ENCOUNTER — Ambulatory Visit: Payer: Medicare Other | Admitting: Physical Therapy

## 2017-03-21 ENCOUNTER — Encounter: Payer: Self-pay | Admitting: Physical Therapy

## 2017-03-21 DIAGNOSIS — G8929 Other chronic pain: Secondary | ICD-10-CM | POA: Diagnosis not present

## 2017-03-21 DIAGNOSIS — M6281 Muscle weakness (generalized): Secondary | ICD-10-CM

## 2017-03-21 DIAGNOSIS — M545 Low back pain, unspecified: Secondary | ICD-10-CM

## 2017-03-21 DIAGNOSIS — M62830 Muscle spasm of back: Secondary | ICD-10-CM | POA: Diagnosis not present

## 2017-03-21 NOTE — Therapy (Signed)
[facility] [address] [place], [place], [address] Phone: [phone]   Fax:  [phone]  Physical Therapy Treatment  Patient Details  Name: Sherry Thompson MRN: [id number] Date of Birth: [birth year] Referring Provider: Dr. Maurice Small   Encounter Date: 03/21/2017  PT End of Session - 03/21/17 1523    Visit Number  13    Date for PT Re-Evaluation  03/28/17    Authorization Type   Gcodes on visit 20: KX at visit 15    PT Start Time  1445    PT Stop Time  1535    PT Time Calculation (min)  50 min    Activity Tolerance  Patient tolerated treatment well;Patient limited by pain    Behavior During Therapy  [facility] for tasks assessed/performed       Past Medical History:  Diagnosis Date  . Chronic fatigue syndrome   . Depression   . Fibromyalgia   . Osteopenia   . Psoriasis     History reviewed. No pertinent surgical history.  There were no vitals filed for this visit.  Subjective Assessment - 03/21/17 1459    Subjective  I have not taken my valium trying to get off of it.  I am now having more pain and not sleeping.      Pertinent History  hx of falls;  osteopenia; fibromyalgia; depression    Limitations  House hold activities;Walking;Lifting;Sitting    How long can you sit comfortably?  30 min    How long can you walk comfortably?  30 min with walking the dog    Diagnostic tests  MRI mild degenerative changes mid thoracic and lower cervical     Patient Stated Goals  decrease pain    Currently in Pain?  Yes    Pain Score  8     Pain Location  Back    Pain Orientation  Lower    Pain Descriptors / Indicators  Aching    Pain Type  Chronic pain    Pain Onset  More than a month ago    Pain Frequency  Constant    Aggravating Factors   weather, stress    Pain Relieving Factors  heat, medication    Multiple Pain Sites  No                      [facility] Adult PT Treatment/Exercise - 03/21/17 0001       Lumbar Exercises: Aerobic   Stationary Bike  Nu-Step L1 seat 7 arms 8 min with moist heat; while assessing patient;decreased time       Lumbar Exercises: Supine   AB Set Limitations  ball squeeze 15x    Clam  15 reps sidely, bil.     Other Supine Lumbar Exercises  marching from bolster 10x    Other Supine Lumbar Exercises  isometric pushes 10x left/left, right/right then push with diagonals right/left 10x each      Modalities   Modalities  Electrical Stimulation;Moist Heat      Moist Heat Therapy   Number Minutes Moist Heat  15 Minutes    Moist Heat Location  Lumbar Spine with heat      Electrical Stimulation   Electrical Stimulation Location  Lumbar spine     Electrical Stimulation Action  IFC    Electrical Stimulation Parameters  15 min, to patient tolerance    Electrical Stimulation Goals  Pain  PT Short Term Goals - 03/12/17 1629      PT SHORT TERM GOAL #1   Title  The patient will demonstrate basic understanding of postural correction, osteopenia precautions and basic HEP      Status  Achieved      PT SHORT TERM GOAL #2   Title  The patient will report a 20% reduction in pain with usual ADLs    Time  4    Period  Weeks    Status  On-going      PT SHORT TERM GOAL #3   Title  The patient will be able to activate transverse abdominals for core stabilization without doing a pelvic tilt or Valsalva maneuver    Status  Achieved        PT Long Term Goals - 02/01/17 0825      PT LONG TERM GOAL #1   Title  The patient will be independent in safe self progression of HEP     Time  8    Period  Weeks    Target Date  03/29/17      PT LONG TERM GOAL #2   Title  The patient will be able to sit for 1 hour with 2-3 breaks and pain at a manageable level in order to have lunch with her friends or family or attend book club    Time  8    Period  Weeks    Status  New      PT LONG TERM GOAL #3   Title  The patient will demonstrate knowledge of proper  body mechanics with ADLs for self management of chronic back pain and osteopenia including standing at the sink    Time  8    Period  Weeks    Status  New      PT LONG TERM GOAL #4   Title  The patient will have core and hip strength at least 4/5 needed for household ADLs and lifting her grandson    Time  8    Period  Weeks    Status  New      PT LONG TERM GOAL #5   Title  FOTO functional outcome score improved from 60% limitation to 49% indicating improved function with less pain    Time  8    Period  Weeks    Status  New            Plan - 03/21/17 1524    Clinical Impression Statement  Patient continues to progress slowly secondary to her chronic pain, fibromyalgia, and stopping her Valium.  Patient was able to add 1 exercise today without too much fatique.  Patient will benefit from her last 2 visits working on HEP she can be discharged to.     Rehab Potential  Good    PT Frequency  2x / week    PT Duration  8 weeks    PT Treatment/Interventions  ADLs/Self Care Home Management;Electrical Stimulation;Cryotherapy;Ultrasound;Traction;Moist Heat;Neuromuscular re-education;Therapeutic exercise;Therapeutic activities;Patient/family education;Manual techniques;Dry needling;Taping    PT Next Visit Plan  prepare for discharge by working on HEP and how to progress herself slowly    PT Home Exercise Plan  progress as needed    Consulted and Agree with Plan of Care  Patient       Patient will benefit from skilled therapeutic intervention in order to improve the following deficits and impairments:  Pain, Decreased range of motion, Decreased strength  Visit Diagnosis: Muscle spasm of back  Chronic bilateral low back pain without sciatica  Muscle weakness (generalized)     Problem List There are no active problems to display for this patient.   Earlie Counts, PT 03/21/17 3:27 PM    [facility] [address], [place], [place], [address] Phone: [phone]   Fax:  [phone]  Name: Sherry Thompson MRN: [id number] Date of Birth: [birth year]

## 2017-03-26 ENCOUNTER — Ambulatory Visit: Payer: Medicare Other | Attending: Family Medicine | Admitting: Physical Therapy

## 2017-03-26 DIAGNOSIS — M545 Low back pain, unspecified: Secondary | ICD-10-CM

## 2017-03-26 DIAGNOSIS — M62830 Muscle spasm of back: Secondary | ICD-10-CM | POA: Diagnosis not present

## 2017-03-26 DIAGNOSIS — M6281 Muscle weakness (generalized): Secondary | ICD-10-CM | POA: Diagnosis not present

## 2017-03-26 DIAGNOSIS — G8929 Other chronic pain: Secondary | ICD-10-CM | POA: Insufficient documentation

## 2017-03-26 NOTE — Patient Instructions (Signed)
      Wall planks:  Engage lower abdominals while lifting forearm 2 inches off the wall alternating 5x   On the 2nd step:  Rock back and forth 5x   Use red band to do clams:  Lying on your back, sidelying, seated 5-10x       Ruben Im PT [facility] [address], [place], [place] [address] Phone # [phone] Fax [phone]

## 2017-03-26 NOTE — Therapy (Signed)
[facility] [address], [place], [place], [address] Phone: [phone]   Fax:  [phone]  Physical Therapy Treatment  Patient Details  Name: Sherry Thompson MRN: [id number] Date of Birth: Feb 12, 1946 Referring Provider: Dr. Maurice Small   Encounter Date: 03/26/2017  PT End of Session - 03/26/17 1611    Visit Number  14    Date for PT Re-Evaluation  03/28/17    Authorization Type   Gcodes on visit 20: KX at visit 15    PT Start Time  1537    PT Stop Time  1615    PT Time Calculation (min)  38 min    Activity Tolerance  Patient tolerated treatment well       Past Medical History:  Diagnosis Date  . Chronic fatigue syndrome   . Depression   . Fibromyalgia   . Osteopenia   . Psoriasis     No past surgical history on file.  There were no vitals filed for this visit.  Subjective Assessment - 03/26/17 1539    Subjective  I've been off my valium for about 10 days but it's caused my to drink more wine.  When I drink some wine I can vacumn and do dishes.      Pertinent History  hx of falls;  osteopenia; fibromyalgia; depression    Currently in Pain?  Yes    Pain Score  7     Pain Location  Back    Pain Type  Chronic pain    Pain Relieving Factors  heat, wine                      [facility] Adult PT Treatment/Exercise - 03/26/17 0001      Neuro Re-ed    Neuro Re-ed Details   neuroscience of pain reeducation      Lumbar Exercises: Stretches   Active Hamstring Stretch Limitations  2nd step stretch rocks 5x    Hip Flexor Stretch  -- psoas doorway strech 2x5 no reach over    Piriformis Stretch  2 reps;30 seconds      Lumbar Exercises: Aerobic   Stationary Bike  Nu-Step L1 10 min with moist heat while discussing HEP       Lumbar Exercises: Standing   Other Standing Lumbar Exercises  wall planks 8x    Other Standing Lumbar Exercises  leg swings at the stairs 30 sec bil      Lumbar Exercises:  Seated   Other Seated Lumbar Exercises  clam red band 10x      Lumbar Exercises: Supine   Clam  10 reps with MH      Lumbar Exercises: Sidelying   Clam  5 reps red band      Moist Heat Therapy   Number Minutes Moist Heat  20 Minutes    Moist Heat Location  Lumbar Spine with heat             PT Education - 03/26/17 1611    Education provided  Yes    Education Details  clams in 3 positions     Person(s) Educated  Patient    Methods  Explanation;Demonstration;Handout    Comprehension  Returned demonstration;Verbalized understanding       PT Short Term Goals - 03/12/17 1629      PT SHORT TERM GOAL #1   Title  The patient will demonstrate basic understanding of postural correction, osteopenia precautions and basic HEP  Status  Achieved      PT SHORT TERM GOAL #2   Title  The patient will report a 20% reduction in pain with usual ADLs    Time  4    Period  Weeks    Status  On-going      PT SHORT TERM GOAL #3   Title  The patient will be able to activate transverse abdominals for core stabilization without doing a pelvic tilt or Valsalva maneuver    Status  Achieved        PT Long Term Goals - 02/01/17 0825      PT LONG TERM GOAL #1   Title  The patient will be independent in safe self progression of HEP     Time  8    Period  Weeks    Target Date  03/29/17      PT LONG TERM GOAL #2   Title  The patient will be able to sit for 1 hour with 2-3 breaks and pain at a manageable level in order to have lunch with her friends or family or attend book club    Time  8    Period  Weeks    Status  New      PT LONG TERM GOAL #3   Title  The patient will demonstrate knowledge of proper body mechanics with ADLs for self management of chronic back pain and osteopenia including standing at the sink    Time  8    Period  Weeks    Status  New      PT LONG TERM GOAL #4   Title  The patient will have core and hip strength at least 4/5 needed for household ADLs and  lifting her grandson    Time  8    Period  Weeks    Status  New      PT LONG TERM GOAL #5   Title  FOTO functional outcome score improved from 60% limitation to 49% indicating improved function with less pain    Time  8    Period  Weeks    Status  New            Plan - 03/26/17 1615    Clinical Impression Statement  The patient continues to need a very slow progression secondary to chronicity of pain and fibromyalgia.  She has not taken a Valium in 10 days (she has been taking Valium for a very long period of time) and her pain is no worse and no acute flare ups since starting PT.  Encourged patient to celebrate this decreased medicine usage as a success.  Discussed frequency and intensity of HEP.      Rehab Potential  Good    PT Frequency  2x / week    PT Treatment/Interventions  ADLs/Self Care Home Management;Electrical Stimulation;Cryotherapy;Ultrasound;Traction;Moist Heat;Neuromuscular re-education;Therapeutic exercise;Therapeutic activities;Patient/family education;Manual techniques;Dry needling;Taping    PT Next Visit Plan  prepare for discharge next visit; review HEP;  FOTO; review goals       Patient will benefit from skilled therapeutic intervention in order to improve the following deficits and impairments:  Pain, Decreased range of motion, Decreased strength  Visit Diagnosis: Muscle spasm of back  Chronic bilateral low back pain without sciatica  Muscle weakness (generalized)     Problem List There are no active problems to display for this patient.  Ruben Im, PT 03/26/17 5:23 PM Phone: [phone] Fax: [phone]  Sherry Thompson 03/26/2017, 5:23 PM  [facility] [address] [place], [place], [address] Phone: [phone]   Fax:  [phone]  Name: Sherry Thompson MRN: [id number] Date of Birth: July 16, 1945

## 2017-03-28 ENCOUNTER — Ambulatory Visit: Payer: Medicare Other | Admitting: Physical Therapy

## 2017-03-28 DIAGNOSIS — M62830 Muscle spasm of back: Secondary | ICD-10-CM | POA: Diagnosis not present

## 2017-03-28 DIAGNOSIS — G8929 Other chronic pain: Secondary | ICD-10-CM | POA: Diagnosis not present

## 2017-03-28 DIAGNOSIS — M545 Low back pain, unspecified: Secondary | ICD-10-CM

## 2017-03-28 DIAGNOSIS — M6281 Muscle weakness (generalized): Secondary | ICD-10-CM | POA: Diagnosis not present

## 2017-03-28 NOTE — Therapy (Signed)
[facility] [address], [place], [place], [address] Phone: [phone]   Fax:  [phone]  Physical Therapy Treatment/Discharge Summary  Patient Details  Name: Sherry Thompson MRN: [id number] Date of Birth: July 23, 1945 Referring Provider: Dr. Maurice Small   Encounter Date: 03/28/2017  PT End of Session - 03/28/17 1527    Visit Number  15    Date for PT Re-Evaluation  03/28/17    Authorization Type   Gcodes on visit 20: KX at visit 15    PT Start Time  1446    PT Stop Time  1530    PT Time Calculation (min)  44 min    Activity Tolerance  Patient tolerated treatment well       Past Medical History:  Diagnosis Date  . Chronic fatigue syndrome   . Depression   . Fibromyalgia   . Osteopenia   . Psoriasis     No past surgical history on file.  There were no vitals filed for this visit.  Subjective Assessment - 03/28/17 1455    Subjective  Still off my valium but I was on my TENs all day yesterday.  A lot of fibromyalgia type of pain.      Pertinent History  hx of falls;  osteopenia; fibromyalgia; depression    How long can you stand comfortably?  5 minutes    How long can you walk comfortably?  30 min with walking the dog    Currently in Pain?  Yes    Pain Score  8     Pain Location  Back    Pain Type  Chronic pain    Aggravating Factors   weather          [facility] PT Assessment - 03/28/17 0001      Observation/Other Assessments   Focus on Therapeutic Outcomes (FOTO)   52% limitation       AROM   Lumbar Flexion  60    Lumbar Extension  10    Lumbar - Right Side Bend  35    Lumbar - Left Side Bend  30      Strength   Right Hip Flexion  5/5    Left Hip Flexion  5/5    Lumbar Flexion  4-/5    Lumbar Extension  4-/5                  [facility] Adult PT Treatment/Exercise - 03/28/17 0001      Therapeutic Activites    Other Therapeutic Activities  standing, walking, transitional movements      Neuro Re-ed    Neuro Re-ed Details   neuroscience of pain reeducation      Lumbar Exercises: Stretches   Active Hamstring Stretch  2 reps;30 seconds    Active Hamstring Stretch Limitations  supine with heat/electrical stimulation    Piriformis Stretch  2 reps;30 seconds      Lumbar Exercises: Aerobic   Stationary Bike  Nu-Step L1 10 min with moist heat while discussing HEP       Lumbar Exercises: Standing   Other Standing Lumbar Exercises  wall planks 8x    Other Standing Lumbar Exercises  leg swings at the stairs 30 sec bil      Lumbar Exercises: Seated   Other Seated Lumbar Exercises  sit to stand 5x      Lumbar Exercises: Supine   Ab Set  5 reps    Clam  10 reps with MH  Other Supine Lumbar Exercises  whole leg press down and lengthener 5x each    Other Supine Lumbar Exercises  isometric pushes 10x left/left, right/right then push with diagonals right/left 10x each decompression head press, shoulder press      Lumbar Exercises: Sidelying   Clam  5 reps      Moist Heat Therapy   Number Minutes Moist Heat  20 Minutes    Moist Heat Location  Lumbar Spine with heat               PT Short Term Goals - 03/28/17 2128      PT SHORT TERM GOAL #1   Title  The patient will demonstrate basic understanding of postural correction, osteopenia precautions and basic HEP      Status  Achieved      PT SHORT TERM GOAL #2   Title  The patient will report a 20% reduction in pain with usual ADLs    Status  Partially Met      PT SHORT TERM GOAL #3   Title  The patient will be able to activate transverse abdominals for core stabilization without doing a pelvic tilt or Valsalva maneuver    Status  Achieved        PT Long Term Goals - 03/28/17 2128      PT LONG TERM GOAL #1   Title  The patient will be independent in safe self progression of HEP     Status  Achieved      PT LONG TERM GOAL #2   Title  The patient will be able to sit for 1 hour with 2-3 breaks and pain at  a manageable level in order to have lunch with her friends or family or attend book club    Status  Partially Met      PT LONG TERM GOAL #3   Title  The patient will demonstrate knowledge of proper body mechanics with ADLs for self management of chronic back pain and osteopenia including standing at the sink    Status  Achieved      PT LONG TERM GOAL #4   Title  The patient will have core and hip strength at least 4/5 needed for household ADLs and lifting her grandson    Status  Achieved      PT LONG TERM GOAL #5   Title  FOTO functional outcome score improved from 60% limitation to 49% indicating improved function with less pain    Status  Partially Met            Plan - 03/28/17 2124    Clinical Impression Statement  The patient is independent with a HEP appropriate for her chronic pain syndrome, low back pain and fibromyalgia.   Her lumbar ROM has improved with flexion and sidebending.  Improved core strength.  Her FOTO has improved from a 60% limitation to 52% limitation.  She has reached max potential with rehab at this point and is ready for discharge to a HEP.  Partial goals met.        PHYSICAL THERAPY DISCHARGE SUMMARY  Visits from Start of Care: 15  Current functional level related to goals / functional outcomes: See clinical impressions above   Remaining deficits: As above   Education / Equipment: Comprehensive HEP Plan: Patient agrees to discharge.  Patient goals were partially met. Patient is being discharged due to meeting the stated rehab goals.  ?????        Patient will  benefit from skilled therapeutic intervention in order to improve the following deficits and impairments:     Visit Diagnosis: Muscle spasm of back  Chronic bilateral low back pain without sciatica  Muscle weakness (generalized)   G-Codes - 2017/04/07 2127/07/03    Functional Assessment Tool Used (Outpatient Only)  Clinical impressions; FOTO    Functional Limitation  Mobility: Walking  and moving around    Mobility: Walking and Moving Around Goal Status [phone])  At least 40 percent but less than 60 percent impaired, limited or restricted    Mobility: Walking and Moving Around Discharge Status [phone])  At least 40 percent but less than 60 percent impaired, limited or restricted       Problem List There are no active problems to display for this patient.  Ruben Im, PT 04/07/2017 9:32 PM Phone: [phone] Fax: [phone]  [name] Apr 07, 2017, 9:30 PM  [facility] [address], [place], [place], [address] Phone: [phone]   Fax:  [phone]  Name: Sherry Thompson MRN: [id number] Date of Birth: 05-Feb-1946

## 2017-04-25 DIAGNOSIS — L400 Psoriasis vulgaris: Secondary | ICD-10-CM | POA: Diagnosis not present

## 2017-04-25 DIAGNOSIS — L905 Scar conditions and fibrosis of skin: Secondary | ICD-10-CM | POA: Diagnosis not present

## 2017-04-25 DIAGNOSIS — L700 Acne vulgaris: Secondary | ICD-10-CM | POA: Diagnosis not present

## 2017-04-26 DIAGNOSIS — L400 Psoriasis vulgaris: Secondary | ICD-10-CM | POA: Diagnosis not present

## 2017-04-29 DIAGNOSIS — L400 Psoriasis vulgaris: Secondary | ICD-10-CM | POA: Diagnosis not present

## 2017-05-01 DIAGNOSIS — L400 Psoriasis vulgaris: Secondary | ICD-10-CM | POA: Diagnosis not present

## 2017-05-03 DIAGNOSIS — L400 Psoriasis vulgaris: Secondary | ICD-10-CM | POA: Diagnosis not present

## 2017-05-06 DIAGNOSIS — L400 Psoriasis vulgaris: Secondary | ICD-10-CM | POA: Diagnosis not present

## 2017-05-08 DIAGNOSIS — L400 Psoriasis vulgaris: Secondary | ICD-10-CM | POA: Diagnosis not present

## 2017-05-10 DIAGNOSIS — L400 Psoriasis vulgaris: Secondary | ICD-10-CM | POA: Diagnosis not present

## 2017-05-13 DIAGNOSIS — L400 Psoriasis vulgaris: Secondary | ICD-10-CM | POA: Diagnosis not present

## 2017-05-15 DIAGNOSIS — L400 Psoriasis vulgaris: Secondary | ICD-10-CM | POA: Diagnosis not present

## 2017-05-17 DIAGNOSIS — L400 Psoriasis vulgaris: Secondary | ICD-10-CM | POA: Diagnosis not present

## 2017-05-20 DIAGNOSIS — L400 Psoriasis vulgaris: Secondary | ICD-10-CM | POA: Diagnosis not present

## 2017-05-22 DIAGNOSIS — L400 Psoriasis vulgaris: Secondary | ICD-10-CM | POA: Diagnosis not present

## 2017-05-24 DIAGNOSIS — L400 Psoriasis vulgaris: Secondary | ICD-10-CM | POA: Diagnosis not present

## 2017-05-27 DIAGNOSIS — L400 Psoriasis vulgaris: Secondary | ICD-10-CM | POA: Diagnosis not present

## 2017-05-29 DIAGNOSIS — L400 Psoriasis vulgaris: Secondary | ICD-10-CM | POA: Diagnosis not present

## 2017-05-31 DIAGNOSIS — L400 Psoriasis vulgaris: Secondary | ICD-10-CM | POA: Diagnosis not present

## 2017-06-03 DIAGNOSIS — L400 Psoriasis vulgaris: Secondary | ICD-10-CM | POA: Diagnosis not present

## 2017-06-05 DIAGNOSIS — L400 Psoriasis vulgaris: Secondary | ICD-10-CM | POA: Diagnosis not present

## 2017-06-07 DIAGNOSIS — L400 Psoriasis vulgaris: Secondary | ICD-10-CM | POA: Diagnosis not present

## 2017-06-10 DIAGNOSIS — L400 Psoriasis vulgaris: Secondary | ICD-10-CM | POA: Diagnosis not present

## 2017-06-12 DIAGNOSIS — L400 Psoriasis vulgaris: Secondary | ICD-10-CM | POA: Diagnosis not present

## 2017-06-14 DIAGNOSIS — L400 Psoriasis vulgaris: Secondary | ICD-10-CM | POA: Diagnosis not present

## 2017-06-17 DIAGNOSIS — L400 Psoriasis vulgaris: Secondary | ICD-10-CM | POA: Diagnosis not present

## 2017-06-19 DIAGNOSIS — L400 Psoriasis vulgaris: Secondary | ICD-10-CM | POA: Diagnosis not present

## 2017-06-21 DIAGNOSIS — L400 Psoriasis vulgaris: Secondary | ICD-10-CM | POA: Diagnosis not present

## 2017-06-24 DIAGNOSIS — L400 Psoriasis vulgaris: Secondary | ICD-10-CM | POA: Diagnosis not present

## 2017-06-25 DIAGNOSIS — E559 Vitamin D deficiency, unspecified: Secondary | ICD-10-CM | POA: Diagnosis not present

## 2017-06-25 DIAGNOSIS — R739 Hyperglycemia, unspecified: Secondary | ICD-10-CM | POA: Diagnosis not present

## 2017-06-25 DIAGNOSIS — R5382 Chronic fatigue, unspecified: Secondary | ICD-10-CM | POA: Diagnosis not present

## 2017-06-25 DIAGNOSIS — E039 Hypothyroidism, unspecified: Secondary | ICD-10-CM | POA: Diagnosis not present

## 2017-06-25 DIAGNOSIS — M797 Fibromyalgia: Secondary | ICD-10-CM | POA: Diagnosis not present

## 2017-06-25 DIAGNOSIS — Z5181 Encounter for therapeutic drug level monitoring: Secondary | ICD-10-CM | POA: Diagnosis not present

## 2017-06-26 ENCOUNTER — Other Ambulatory Visit: Payer: Self-pay | Admitting: Family Medicine

## 2017-06-26 DIAGNOSIS — L400 Psoriasis vulgaris: Secondary | ICD-10-CM | POA: Diagnosis not present

## 2017-06-26 DIAGNOSIS — N83209 Unspecified ovarian cyst, unspecified side: Secondary | ICD-10-CM

## 2017-06-27 DIAGNOSIS — L409 Psoriasis, unspecified: Secondary | ICD-10-CM | POA: Diagnosis not present

## 2017-06-27 DIAGNOSIS — M797 Fibromyalgia: Secondary | ICD-10-CM | POA: Diagnosis not present

## 2017-06-27 DIAGNOSIS — R5382 Chronic fatigue, unspecified: Secondary | ICD-10-CM | POA: Diagnosis not present

## 2017-06-27 DIAGNOSIS — M545 Low back pain: Secondary | ICD-10-CM | POA: Diagnosis not present

## 2017-06-27 DIAGNOSIS — M79641 Pain in right hand: Secondary | ICD-10-CM | POA: Diagnosis not present

## 2017-06-27 DIAGNOSIS — F4320 Adjustment disorder, unspecified: Secondary | ICD-10-CM | POA: Diagnosis not present

## 2017-06-27 DIAGNOSIS — M79642 Pain in left hand: Secondary | ICD-10-CM | POA: Diagnosis not present

## 2017-06-27 DIAGNOSIS — Z6822 Body mass index (BMI) 22.0-22.9, adult: Secondary | ICD-10-CM | POA: Diagnosis not present

## 2017-06-27 DIAGNOSIS — M2550 Pain in unspecified joint: Secondary | ICD-10-CM | POA: Diagnosis not present

## 2017-06-28 DIAGNOSIS — L400 Psoriasis vulgaris: Secondary | ICD-10-CM | POA: Diagnosis not present

## 2017-07-01 DIAGNOSIS — L400 Psoriasis vulgaris: Secondary | ICD-10-CM | POA: Diagnosis not present

## 2017-07-02 ENCOUNTER — Ambulatory Visit
Admission: RE | Admit: 2017-07-02 | Discharge: 2017-07-02 | Disposition: A | Discharge status: Home / Self Care | Payer: Medicare Other | Source: Ambulatory Visit | Attending: Family Medicine | Admitting: Family Medicine

## 2017-07-02 DIAGNOSIS — N83201 Unspecified ovarian cyst, right side: Secondary | ICD-10-CM | POA: Diagnosis not present

## 2017-07-02 DIAGNOSIS — N83202 Unspecified ovarian cyst, left side: Secondary | ICD-10-CM | POA: Diagnosis not present

## 2017-07-02 DIAGNOSIS — N83209 Unspecified ovarian cyst, unspecified side: Secondary | ICD-10-CM

## 2017-07-03 DIAGNOSIS — Z91018 Allergy to other foods: Secondary | ICD-10-CM | POA: Diagnosis not present

## 2017-07-05 DIAGNOSIS — L400 Psoriasis vulgaris: Secondary | ICD-10-CM | POA: Diagnosis not present

## 2017-07-05 DIAGNOSIS — Z803 Family history of malignant neoplasm of breast: Secondary | ICD-10-CM | POA: Diagnosis not present

## 2017-07-05 DIAGNOSIS — Z808 Family history of malignant neoplasm of other organs or systems: Secondary | ICD-10-CM | POA: Diagnosis not present

## 2017-07-05 DIAGNOSIS — Z809 Family history of malignant neoplasm, unspecified: Secondary | ICD-10-CM | POA: Diagnosis not present

## 2017-07-05 DIAGNOSIS — Z800 Family history of malignant neoplasm of digestive organs: Secondary | ICD-10-CM | POA: Diagnosis not present

## 2017-07-08 ENCOUNTER — Other Ambulatory Visit: Payer: Self-pay | Admitting: Family Medicine

## 2017-07-08 DIAGNOSIS — N83202 Unspecified ovarian cyst, left side: Secondary | ICD-10-CM

## 2017-07-08 DIAGNOSIS — N83201 Unspecified ovarian cyst, right side: Secondary | ICD-10-CM

## 2017-07-08 DIAGNOSIS — L400 Psoriasis vulgaris: Secondary | ICD-10-CM | POA: Diagnosis not present

## 2017-07-10 DIAGNOSIS — L400 Psoriasis vulgaris: Secondary | ICD-10-CM | POA: Diagnosis not present

## 2017-07-12 DIAGNOSIS — L400 Psoriasis vulgaris: Secondary | ICD-10-CM | POA: Diagnosis not present

## 2017-07-15 DIAGNOSIS — L400 Psoriasis vulgaris: Secondary | ICD-10-CM | POA: Diagnosis not present

## 2017-07-17 DIAGNOSIS — L400 Psoriasis vulgaris: Secondary | ICD-10-CM | POA: Diagnosis not present

## 2017-07-19 DIAGNOSIS — L400 Psoriasis vulgaris: Secondary | ICD-10-CM | POA: Diagnosis not present

## 2017-07-22 DIAGNOSIS — L400 Psoriasis vulgaris: Secondary | ICD-10-CM | POA: Diagnosis not present

## 2017-07-24 DIAGNOSIS — L400 Psoriasis vulgaris: Secondary | ICD-10-CM | POA: Diagnosis not present

## 2017-07-26 DIAGNOSIS — L400 Psoriasis vulgaris: Secondary | ICD-10-CM | POA: Diagnosis not present

## 2017-07-29 DIAGNOSIS — L400 Psoriasis vulgaris: Secondary | ICD-10-CM | POA: Diagnosis not present

## 2017-08-02 DIAGNOSIS — L400 Psoriasis vulgaris: Secondary | ICD-10-CM | POA: Diagnosis not present

## 2017-08-03 ENCOUNTER — Ambulatory Visit
Admission: RE | Admit: 2017-08-03 | Discharge: 2017-08-03 | Disposition: A | Discharge status: Home / Self Care | Payer: Medicare Other | Source: Ambulatory Visit | Attending: Family Medicine | Admitting: Family Medicine

## 2017-08-03 DIAGNOSIS — N83201 Unspecified ovarian cyst, right side: Secondary | ICD-10-CM | POA: Diagnosis not present

## 2017-08-03 DIAGNOSIS — N83202 Unspecified ovarian cyst, left side: Secondary | ICD-10-CM | POA: Diagnosis not present

## 2017-08-05 DIAGNOSIS — L400 Psoriasis vulgaris: Secondary | ICD-10-CM | POA: Diagnosis not present

## 2017-08-08 DIAGNOSIS — L400 Psoriasis vulgaris: Secondary | ICD-10-CM | POA: Diagnosis not present

## 2017-08-12 DIAGNOSIS — L400 Psoriasis vulgaris: Secondary | ICD-10-CM | POA: Diagnosis not present

## 2017-08-16 DIAGNOSIS — L400 Psoriasis vulgaris: Secondary | ICD-10-CM | POA: Diagnosis not present

## 2017-08-16 DIAGNOSIS — D225 Melanocytic nevi of trunk: Secondary | ICD-10-CM | POA: Diagnosis not present

## 2017-08-16 DIAGNOSIS — L821 Other seborrheic keratosis: Secondary | ICD-10-CM | POA: Diagnosis not present

## 2017-08-16 DIAGNOSIS — D485 Neoplasm of uncertain behavior of skin: Secondary | ICD-10-CM | POA: Diagnosis not present

## 2017-08-19 DIAGNOSIS — L400 Psoriasis vulgaris: Secondary | ICD-10-CM | POA: Diagnosis not present

## 2017-08-23 DIAGNOSIS — L400 Psoriasis vulgaris: Secondary | ICD-10-CM | POA: Diagnosis not present

## 2017-08-27 DIAGNOSIS — Z136 Encounter for screening for cardiovascular disorders: Secondary | ICD-10-CM | POA: Diagnosis not present

## 2017-08-27 DIAGNOSIS — E785 Hyperlipidemia, unspecified: Secondary | ICD-10-CM | POA: Diagnosis not present

## 2017-08-27 DIAGNOSIS — Z1211 Encounter for screening for malignant neoplasm of colon: Secondary | ICD-10-CM | POA: Diagnosis not present

## 2017-08-27 DIAGNOSIS — E039 Hypothyroidism, unspecified: Secondary | ICD-10-CM | POA: Diagnosis not present

## 2017-08-27 DIAGNOSIS — Z0000 Encounter for general adult medical examination without abnormal findings: Secondary | ICD-10-CM | POA: Diagnosis not present

## 2017-08-27 DIAGNOSIS — E538 Deficiency of other specified B group vitamins: Secondary | ICD-10-CM | POA: Diagnosis not present

## 2017-08-27 DIAGNOSIS — Z5181 Encounter for therapeutic drug level monitoring: Secondary | ICD-10-CM | POA: Diagnosis not present

## 2017-08-27 DIAGNOSIS — M1990 Unspecified osteoarthritis, unspecified site: Secondary | ICD-10-CM | POA: Diagnosis not present

## 2017-08-28 DIAGNOSIS — N839 Noninflammatory disorder of ovary, fallopian tube and broad ligament, unspecified: Secondary | ICD-10-CM | POA: Diagnosis not present

## 2017-08-28 DIAGNOSIS — N83292 Other ovarian cyst, left side: Secondary | ICD-10-CM | POA: Diagnosis not present

## 2017-08-28 DIAGNOSIS — D3911 Neoplasm of uncertain behavior of right ovary: Secondary | ICD-10-CM | POA: Diagnosis not present

## 2017-08-28 DIAGNOSIS — D3912 Neoplasm of uncertain behavior of left ovary: Secondary | ICD-10-CM | POA: Diagnosis not present

## 2017-08-28 DIAGNOSIS — N83291 Other ovarian cyst, right side: Secondary | ICD-10-CM | POA: Diagnosis not present

## 2017-08-29 DIAGNOSIS — Z1211 Encounter for screening for malignant neoplasm of colon: Secondary | ICD-10-CM | POA: Diagnosis not present

## 2017-08-30 DIAGNOSIS — L400 Psoriasis vulgaris: Secondary | ICD-10-CM | POA: Diagnosis not present

## 2017-09-02 DIAGNOSIS — L400 Psoriasis vulgaris: Secondary | ICD-10-CM | POA: Diagnosis not present

## 2017-09-06 DIAGNOSIS — L400 Psoriasis vulgaris: Secondary | ICD-10-CM | POA: Diagnosis not present

## 2017-09-09 DIAGNOSIS — L400 Psoriasis vulgaris: Secondary | ICD-10-CM | POA: Diagnosis not present

## 2017-09-13 DIAGNOSIS — L400 Psoriasis vulgaris: Secondary | ICD-10-CM | POA: Diagnosis not present

## 2017-09-17 DIAGNOSIS — L400 Psoriasis vulgaris: Secondary | ICD-10-CM | POA: Diagnosis not present

## 2017-09-20 DIAGNOSIS — L400 Psoriasis vulgaris: Secondary | ICD-10-CM | POA: Diagnosis not present

## 2017-09-23 DIAGNOSIS — L400 Psoriasis vulgaris: Secondary | ICD-10-CM | POA: Diagnosis not present

## 2017-10-09 DIAGNOSIS — N83202 Unspecified ovarian cyst, left side: Secondary | ICD-10-CM | POA: Diagnosis not present

## 2017-10-09 DIAGNOSIS — N83201 Unspecified ovarian cyst, right side: Secondary | ICD-10-CM | POA: Diagnosis not present

## 2017-10-18 DIAGNOSIS — R140 Abdominal distension (gaseous): Secondary | ICD-10-CM | POA: Diagnosis not present

## 2017-10-18 DIAGNOSIS — G4700 Insomnia, unspecified: Secondary | ICD-10-CM | POA: Diagnosis not present

## 2018-01-27 DIAGNOSIS — F341 Dysthymic disorder: Secondary | ICD-10-CM | POA: Diagnosis not present

## 2018-02-03 DIAGNOSIS — F341 Dysthymic disorder: Secondary | ICD-10-CM | POA: Diagnosis not present

## 2018-02-12 DIAGNOSIS — F341 Dysthymic disorder: Secondary | ICD-10-CM | POA: Diagnosis not present

## 2018-02-18 DIAGNOSIS — L738 Other specified follicular disorders: Secondary | ICD-10-CM | POA: Diagnosis not present

## 2018-02-18 DIAGNOSIS — L400 Psoriasis vulgaris: Secondary | ICD-10-CM | POA: Diagnosis not present

## 2018-02-18 DIAGNOSIS — D225 Melanocytic nevi of trunk: Secondary | ICD-10-CM | POA: Diagnosis not present

## 2018-02-18 DIAGNOSIS — L821 Other seborrheic keratosis: Secondary | ICD-10-CM | POA: Diagnosis not present

## 2018-02-18 DIAGNOSIS — D1801 Hemangioma of skin and subcutaneous tissue: Secondary | ICD-10-CM | POA: Diagnosis not present

## 2018-02-18 DIAGNOSIS — D692 Other nonthrombocytopenic purpura: Secondary | ICD-10-CM | POA: Diagnosis not present

## 2018-02-19 DIAGNOSIS — F341 Dysthymic disorder: Secondary | ICD-10-CM | POA: Diagnosis not present

## 2018-02-24 DIAGNOSIS — F341 Dysthymic disorder: Secondary | ICD-10-CM | POA: Diagnosis not present

## 2018-03-03 DIAGNOSIS — F341 Dysthymic disorder: Secondary | ICD-10-CM | POA: Diagnosis not present

## 2018-03-06 DIAGNOSIS — E785 Hyperlipidemia, unspecified: Secondary | ICD-10-CM | POA: Diagnosis not present

## 2018-03-06 DIAGNOSIS — D239 Other benign neoplasm of skin, unspecified: Secondary | ICD-10-CM | POA: Diagnosis not present

## 2018-03-06 DIAGNOSIS — B3789 Other sites of candidiasis: Secondary | ICD-10-CM | POA: Diagnosis not present

## 2018-03-06 DIAGNOSIS — G4700 Insomnia, unspecified: Secondary | ICD-10-CM | POA: Diagnosis not present

## 2018-03-06 DIAGNOSIS — L409 Psoriasis, unspecified: Secondary | ICD-10-CM | POA: Diagnosis not present

## 2018-03-06 DIAGNOSIS — R5382 Chronic fatigue, unspecified: Secondary | ICD-10-CM | POA: Diagnosis not present

## 2018-03-06 DIAGNOSIS — F339 Major depressive disorder, recurrent, unspecified: Secondary | ICD-10-CM | POA: Diagnosis not present

## 2018-03-06 DIAGNOSIS — M797 Fibromyalgia: Secondary | ICD-10-CM | POA: Diagnosis not present

## 2018-03-10 DIAGNOSIS — F341 Dysthymic disorder: Secondary | ICD-10-CM | POA: Diagnosis not present

## 2018-03-17 DIAGNOSIS — F341 Dysthymic disorder: Secondary | ICD-10-CM | POA: Diagnosis not present

## 2018-05-01 DIAGNOSIS — N83202 Unspecified ovarian cyst, left side: Secondary | ICD-10-CM | POA: Diagnosis not present

## 2018-05-01 DIAGNOSIS — N83201 Unspecified ovarian cyst, right side: Secondary | ICD-10-CM | POA: Diagnosis not present

## 2018-05-15 DIAGNOSIS — F419 Anxiety disorder, unspecified: Secondary | ICD-10-CM | POA: Diagnosis not present

## 2018-05-16 IMAGING — MR MR LUMBAR SPINE W/O CM
4 of 5 series · 18 of 48 positions shown · non-contrast
Comparison: None.

CLINICAL DATA: Initial evaluation for chronic low back pain for 15
years.

EXAM:
MRI LUMBAR SPINE WITHOUT CONTRAST
TECHNIQUE: Multiplanar, multisequence MR imaging of the lumbar spine was
performed. No intravenous contrast was administered.

[Series 6: T2 · sagittal · 4.0mm · 0.73mm/px · 7 of 15 slices shown (1 of 2)]
[im 1/15]
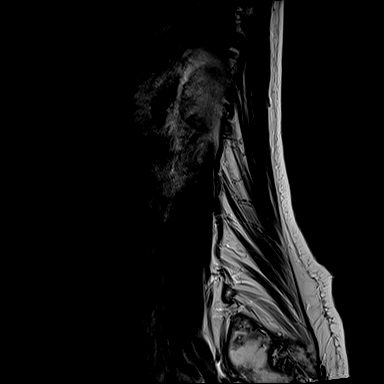
[im 3/15]
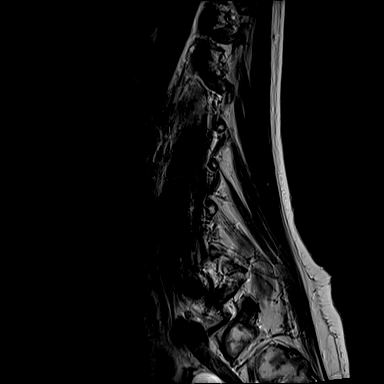
[im 5/15]
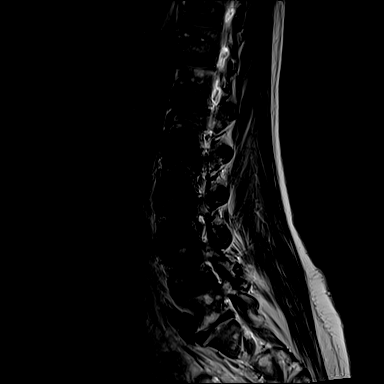
[im 8/15]
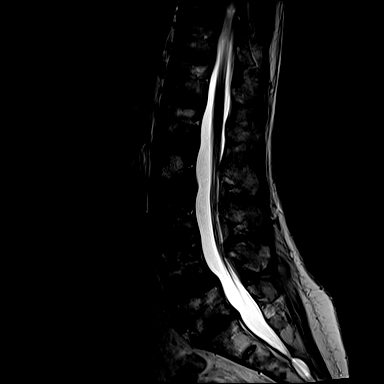
[im 10/15]
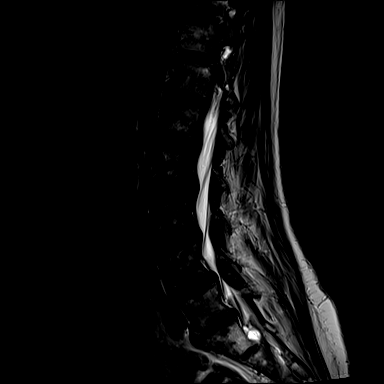
[im 12/15]
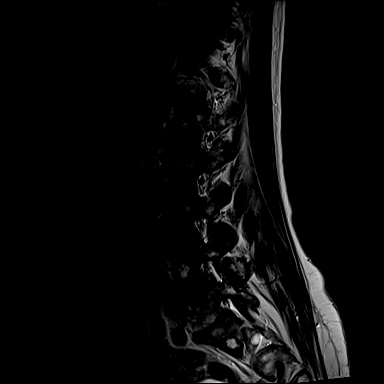
[im 15/15]
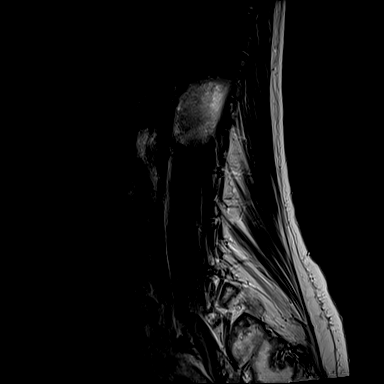

[Series 7: T1 · sagittal · 4.0mm · 0.73mm/px · 3 of 15 slices shown (1 of 2)]
[im 3/15]
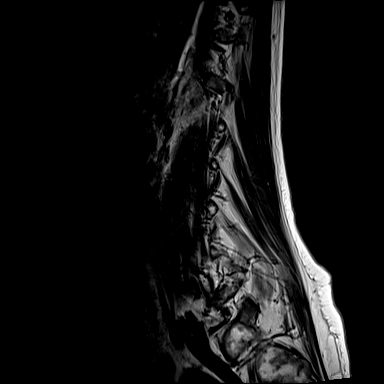
[im 8/15]
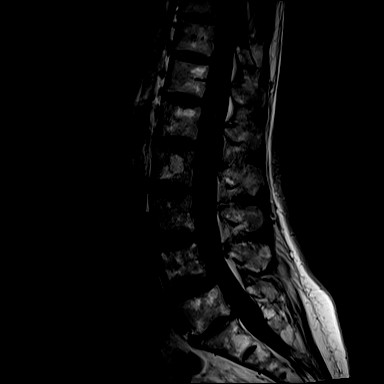
[im 12/15]
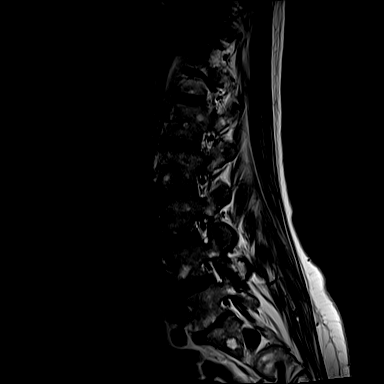

[Series 11: T1 · axial · 4.0mm · 0.28mm/px · z∈[-80,+49]mm · 3 of 34 slices shown (2 of 2)]
[im 6/34]
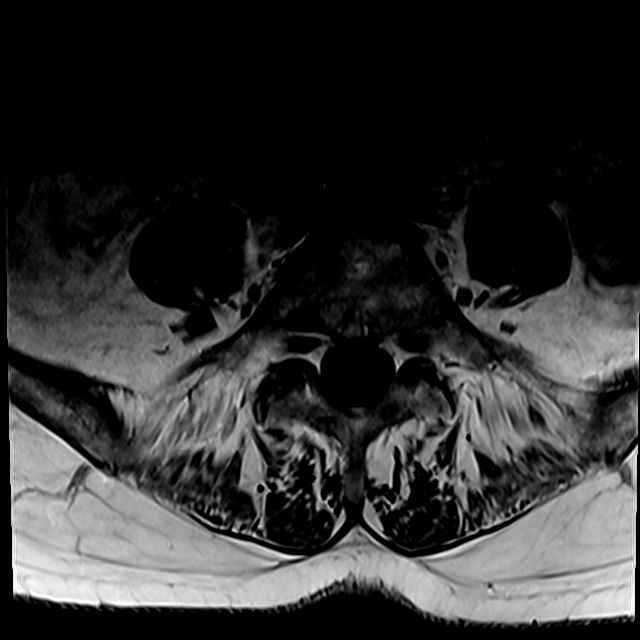
[im 18/34]
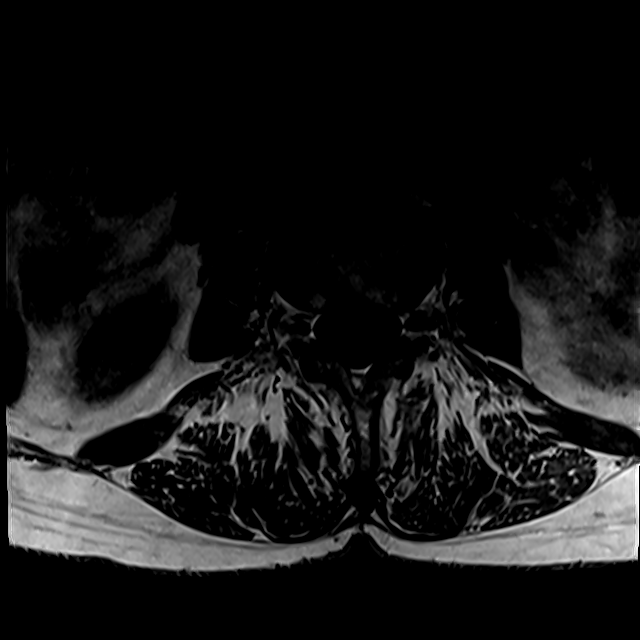
[im 28/34]
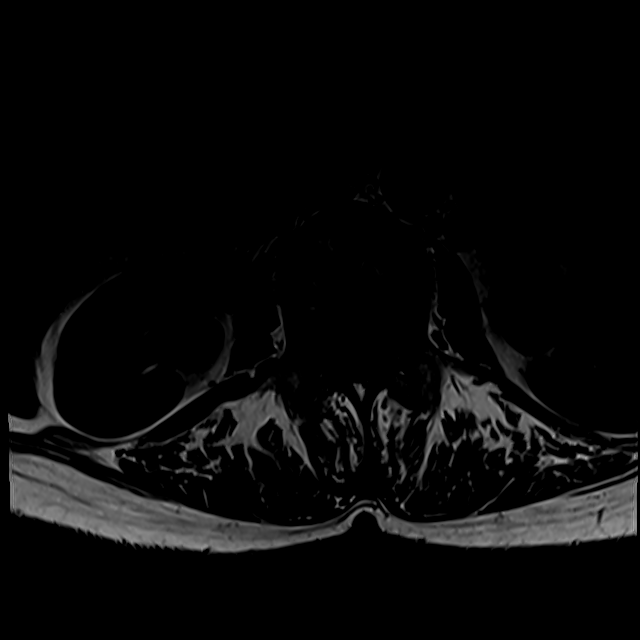

[Series 14: T2 · axial · 4.0mm · 0.28mm/px · z∈[-105,+49]mm · 5 of 34 slices shown (2 of 2)]
[im 1/34]
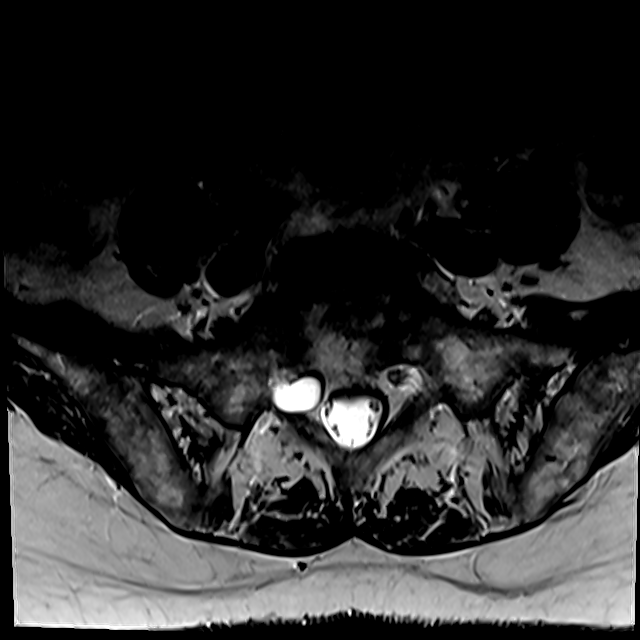
[im 6/34]
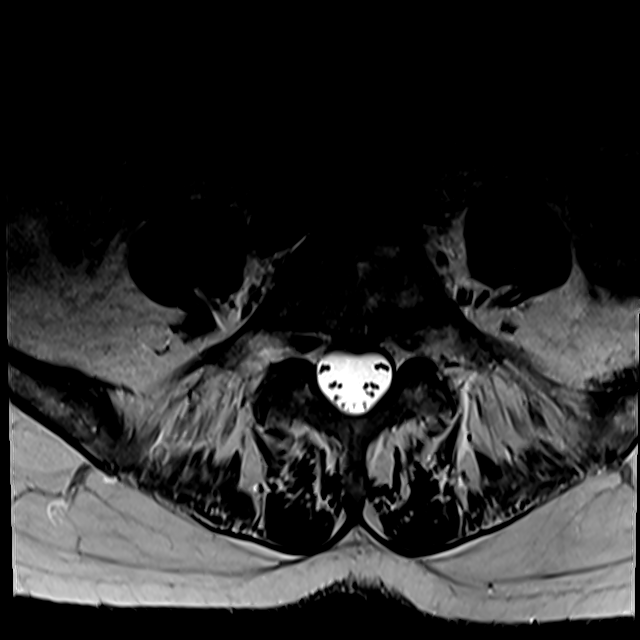
[im 11/34]
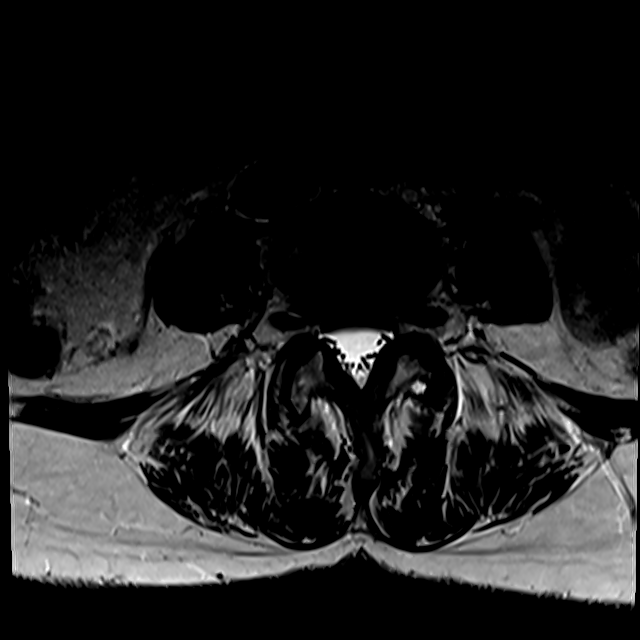
[im 18/34]
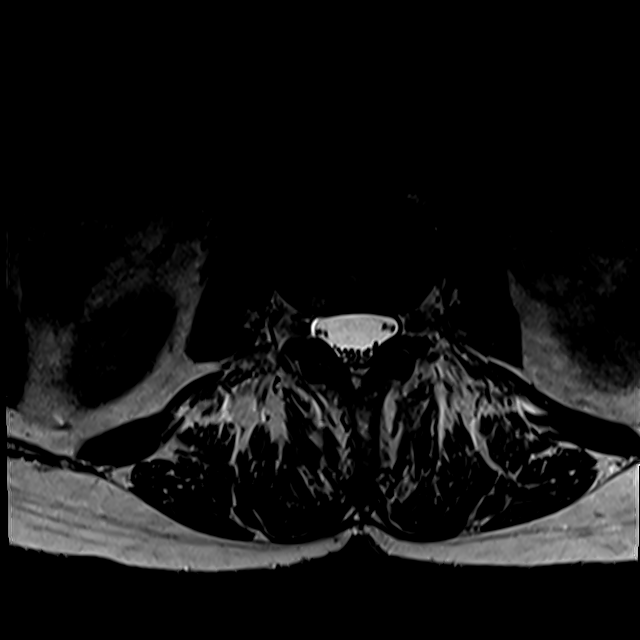
[im 28/34]
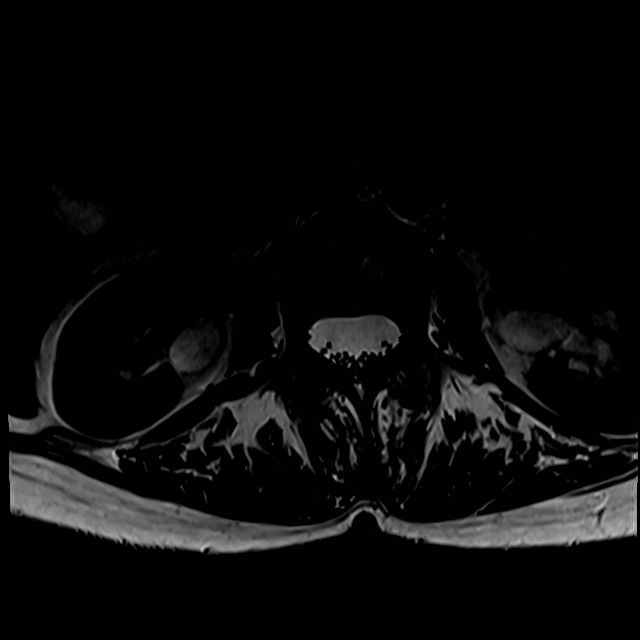

[18 of 48 positions shown; findings below may reference images not displayed]

FINDINGS: Segmentation: Normal segmentation. Lowest well-formed disc is
labeled the L5-S1 level.

Alignment: 3 mm anterolisthesis of L4 on L5. Vertebral bodies
otherwise normally aligned with preservation of the normal lumbar
lordosis.

Vertebrae: Vertebral body heights are well maintained. No evidence
for acute or chronic fracture. Signal intensity within the vertebral
body bone marrow is patchy and heterogeneous in appearance,
suspected to be related to underlying osteopenia. No worrisome
osseous lesions. No abnormal marrow edema.

Conus medullaris: Extends to the L2 level and appears normal.

Paraspinal and other soft tissues: Paraspinous soft tissues within
normal limits. Visualized fissure structures grossly unremarkable.

Disc levels:

L1-2:  Negative.

L2-3: Small right paracentral disc protrusion flattens and indents
the right ventral thecal sac without significant canal stenosis
(series 14, image 10). Foramina remain widely patent.

L3-4: Mild annular disc bulge the, slightly eccentric to the right.
No focal disc protrusion. Mild bilateral facet hypertrophy with
ligamentum flavum thickening. No significant canal or foraminal
stenosis.

L4-5: 3 mm anterolisthesis of L4 on L5. Associated shallow
broad-based posterior disc bulge. Associated annular fissure at the
level of the right neural foramen. No focal disc protrusion.
Moderate bilateral facet arthrosis with mild ligamentum flavum
hypertrophy. Mild bilateral subarticular stenosis without
significant canal narrowing. No significant foraminal encroachment.

L5-S1: Degenerative intervertebral disc space narrowing with disc
desiccation and diffuse disc bulge. No focal disc protrusion. No
significant canal or foraminal stenosis. The
IMPRESSION: 1. 3 mm anterolisthesis of L4 on L5 with associated mild disc bulge
and moderate bilateral facet arthrosis. Resultant mild subarticular
stenosis without neural impingement.
2. Small right paracentral disc protrusion at L2-3 without stenosis
or neural impingement.
3. Additional mild for age degenerative spondylolysis at L3-4 and
L5-S1 without significant stenosis or evidence for neural
impingement.

## 2018-06-20 DIAGNOSIS — B3789 Other sites of candidiasis: Secondary | ICD-10-CM | POA: Diagnosis not present

## 2018-06-20 DIAGNOSIS — N83299 Other ovarian cyst, unspecified side: Secondary | ICD-10-CM | POA: Diagnosis not present

## 2018-06-20 DIAGNOSIS — F339 Major depressive disorder, recurrent, unspecified: Secondary | ICD-10-CM | POA: Diagnosis not present

## 2018-06-20 DIAGNOSIS — E785 Hyperlipidemia, unspecified: Secondary | ICD-10-CM | POA: Diagnosis not present

## 2018-06-20 DIAGNOSIS — B009 Herpesviral infection, unspecified: Secondary | ICD-10-CM | POA: Diagnosis not present

## 2018-06-20 DIAGNOSIS — G4700 Insomnia, unspecified: Secondary | ICD-10-CM | POA: Diagnosis not present

## 2018-06-23 DIAGNOSIS — F331 Major depressive disorder, recurrent, moderate: Secondary | ICD-10-CM | POA: Diagnosis not present

## 2018-06-26 DIAGNOSIS — F331 Major depressive disorder, recurrent, moderate: Secondary | ICD-10-CM | POA: Diagnosis not present

## 2018-07-07 DIAGNOSIS — F331 Major depressive disorder, recurrent, moderate: Secondary | ICD-10-CM | POA: Diagnosis not present

## 2018-07-10 DIAGNOSIS — F331 Major depressive disorder, recurrent, moderate: Secondary | ICD-10-CM | POA: Diagnosis not present

## 2018-07-16 DIAGNOSIS — F331 Major depressive disorder, recurrent, moderate: Secondary | ICD-10-CM | POA: Diagnosis not present

## 2018-07-21 DIAGNOSIS — F331 Major depressive disorder, recurrent, moderate: Secondary | ICD-10-CM | POA: Diagnosis not present

## 2018-07-23 DIAGNOSIS — F331 Major depressive disorder, recurrent, moderate: Secondary | ICD-10-CM | POA: Diagnosis not present

## 2018-07-28 DIAGNOSIS — F331 Major depressive disorder, recurrent, moderate: Secondary | ICD-10-CM | POA: Diagnosis not present

## 2018-08-04 DIAGNOSIS — F331 Major depressive disorder, recurrent, moderate: Secondary | ICD-10-CM | POA: Diagnosis not present

## 2018-08-12 DIAGNOSIS — F331 Major depressive disorder, recurrent, moderate: Secondary | ICD-10-CM | POA: Diagnosis not present

## 2018-08-18 DIAGNOSIS — F331 Major depressive disorder, recurrent, moderate: Secondary | ICD-10-CM | POA: Diagnosis not present

## 2018-08-25 DIAGNOSIS — F331 Major depressive disorder, recurrent, moderate: Secondary | ICD-10-CM | POA: Diagnosis not present

## 2018-09-01 DIAGNOSIS — F331 Major depressive disorder, recurrent, moderate: Secondary | ICD-10-CM | POA: Diagnosis not present

## 2018-09-08 DIAGNOSIS — F331 Major depressive disorder, recurrent, moderate: Secondary | ICD-10-CM | POA: Diagnosis not present

## 2018-09-16 DIAGNOSIS — F331 Major depressive disorder, recurrent, moderate: Secondary | ICD-10-CM | POA: Diagnosis not present

## 2018-09-22 DIAGNOSIS — F331 Major depressive disorder, recurrent, moderate: Secondary | ICD-10-CM | POA: Diagnosis not present

## 2018-09-29 DIAGNOSIS — F331 Major depressive disorder, recurrent, moderate: Secondary | ICD-10-CM | POA: Diagnosis not present

## 2018-10-14 DIAGNOSIS — F331 Major depressive disorder, recurrent, moderate: Secondary | ICD-10-CM | POA: Diagnosis not present

## 2018-10-17 DIAGNOSIS — D239 Other benign neoplasm of skin, unspecified: Secondary | ICD-10-CM | POA: Diagnosis not present

## 2018-10-17 DIAGNOSIS — F419 Anxiety disorder, unspecified: Secondary | ICD-10-CM | POA: Diagnosis not present

## 2018-10-17 DIAGNOSIS — Z1211 Encounter for screening for malignant neoplasm of colon: Secondary | ICD-10-CM | POA: Diagnosis not present

## 2018-10-17 DIAGNOSIS — W57XXXA Bitten or stung by nonvenomous insect and other nonvenomous arthropods, initial encounter: Secondary | ICD-10-CM | POA: Diagnosis not present

## 2018-10-17 DIAGNOSIS — A6920 Lyme disease, unspecified: Secondary | ICD-10-CM | POA: Diagnosis not present

## 2018-10-17 DIAGNOSIS — M797 Fibromyalgia: Secondary | ICD-10-CM | POA: Diagnosis not present

## 2018-10-17 DIAGNOSIS — B009 Herpesviral infection, unspecified: Secondary | ICD-10-CM | POA: Diagnosis not present

## 2018-10-17 DIAGNOSIS — M8580 Other specified disorders of bone density and structure, unspecified site: Secondary | ICD-10-CM | POA: Diagnosis not present

## 2018-10-17 DIAGNOSIS — Z0000 Encounter for general adult medical examination without abnormal findings: Secondary | ICD-10-CM | POA: Diagnosis not present

## 2018-10-17 DIAGNOSIS — F339 Major depressive disorder, recurrent, unspecified: Secondary | ICD-10-CM | POA: Diagnosis not present

## 2018-10-17 DIAGNOSIS — E785 Hyperlipidemia, unspecified: Secondary | ICD-10-CM | POA: Diagnosis not present

## 2018-10-17 DIAGNOSIS — R5382 Chronic fatigue, unspecified: Secondary | ICD-10-CM | POA: Diagnosis not present

## 2018-10-20 DIAGNOSIS — F331 Major depressive disorder, recurrent, moderate: Secondary | ICD-10-CM | POA: Diagnosis not present

## 2018-10-27 DIAGNOSIS — F331 Major depressive disorder, recurrent, moderate: Secondary | ICD-10-CM | POA: Diagnosis not present

## 2018-10-29 DIAGNOSIS — Z1211 Encounter for screening for malignant neoplasm of colon: Secondary | ICD-10-CM | POA: Diagnosis not present

## 2018-11-03 DIAGNOSIS — F331 Major depressive disorder, recurrent, moderate: Secondary | ICD-10-CM | POA: Diagnosis not present

## 2018-11-10 DIAGNOSIS — F331 Major depressive disorder, recurrent, moderate: Secondary | ICD-10-CM | POA: Diagnosis not present

## 2018-11-17 DIAGNOSIS — F331 Major depressive disorder, recurrent, moderate: Secondary | ICD-10-CM | POA: Diagnosis not present

## 2018-11-25 DIAGNOSIS — F331 Major depressive disorder, recurrent, moderate: Secondary | ICD-10-CM | POA: Diagnosis not present

## 2018-12-01 DIAGNOSIS — F331 Major depressive disorder, recurrent, moderate: Secondary | ICD-10-CM | POA: Diagnosis not present

## 2018-12-08 DIAGNOSIS — F331 Major depressive disorder, recurrent, moderate: Secondary | ICD-10-CM | POA: Diagnosis not present

## 2018-12-15 DIAGNOSIS — F331 Major depressive disorder, recurrent, moderate: Secondary | ICD-10-CM | POA: Diagnosis not present

## 2018-12-22 DIAGNOSIS — F331 Major depressive disorder, recurrent, moderate: Secondary | ICD-10-CM | POA: Diagnosis not present

## 2018-12-30 DIAGNOSIS — F331 Major depressive disorder, recurrent, moderate: Secondary | ICD-10-CM | POA: Diagnosis not present

## 2019-01-05 DIAGNOSIS — F331 Major depressive disorder, recurrent, moderate: Secondary | ICD-10-CM | POA: Diagnosis not present

## 2019-01-12 DIAGNOSIS — F331 Major depressive disorder, recurrent, moderate: Secondary | ICD-10-CM | POA: Diagnosis not present

## 2019-01-19 DIAGNOSIS — F331 Major depressive disorder, recurrent, moderate: Secondary | ICD-10-CM | POA: Diagnosis not present

## 2019-01-26 DIAGNOSIS — F331 Major depressive disorder, recurrent, moderate: Secondary | ICD-10-CM | POA: Diagnosis not present

## 2019-02-02 DIAGNOSIS — F331 Major depressive disorder, recurrent, moderate: Secondary | ICD-10-CM | POA: Diagnosis not present

## 2019-02-09 DIAGNOSIS — F331 Major depressive disorder, recurrent, moderate: Secondary | ICD-10-CM | POA: Diagnosis not present

## 2019-02-16 DIAGNOSIS — F331 Major depressive disorder, recurrent, moderate: Secondary | ICD-10-CM | POA: Diagnosis not present

## 2019-02-23 DIAGNOSIS — F331 Major depressive disorder, recurrent, moderate: Secondary | ICD-10-CM | POA: Diagnosis not present

## 2019-03-02 DIAGNOSIS — F331 Major depressive disorder, recurrent, moderate: Secondary | ICD-10-CM | POA: Diagnosis not present

## 2019-03-09 DIAGNOSIS — F331 Major depressive disorder, recurrent, moderate: Secondary | ICD-10-CM | POA: Diagnosis not present

## 2019-03-16 DIAGNOSIS — F331 Major depressive disorder, recurrent, moderate: Secondary | ICD-10-CM | POA: Diagnosis not present

## 2019-03-23 DIAGNOSIS — F331 Major depressive disorder, recurrent, moderate: Secondary | ICD-10-CM | POA: Diagnosis not present

## 2019-03-30 DIAGNOSIS — F331 Major depressive disorder, recurrent, moderate: Secondary | ICD-10-CM | POA: Diagnosis not present

## 2019-04-20 DIAGNOSIS — F331 Major depressive disorder, recurrent, moderate: Secondary | ICD-10-CM | POA: Diagnosis not present

## 2019-04-27 DIAGNOSIS — F331 Major depressive disorder, recurrent, moderate: Secondary | ICD-10-CM | POA: Diagnosis not present

## 2019-04-28 DIAGNOSIS — F331 Major depressive disorder, recurrent, moderate: Secondary | ICD-10-CM | POA: Diagnosis not present

## 2019-05-04 DIAGNOSIS — F331 Major depressive disorder, recurrent, moderate: Secondary | ICD-10-CM | POA: Diagnosis not present

## 2019-05-05 DIAGNOSIS — F331 Major depressive disorder, recurrent, moderate: Secondary | ICD-10-CM | POA: Diagnosis not present

## 2019-05-11 DIAGNOSIS — F331 Major depressive disorder, recurrent, moderate: Secondary | ICD-10-CM | POA: Diagnosis not present

## 2019-05-18 DIAGNOSIS — F331 Major depressive disorder, recurrent, moderate: Secondary | ICD-10-CM | POA: Diagnosis not present

## 2019-05-25 DIAGNOSIS — F331 Major depressive disorder, recurrent, moderate: Secondary | ICD-10-CM | POA: Diagnosis not present

## 2019-06-01 DIAGNOSIS — F331 Major depressive disorder, recurrent, moderate: Secondary | ICD-10-CM | POA: Diagnosis not present

## 2019-06-08 DIAGNOSIS — F331 Major depressive disorder, recurrent, moderate: Secondary | ICD-10-CM | POA: Diagnosis not present

## 2019-06-15 DIAGNOSIS — F331 Major depressive disorder, recurrent, moderate: Secondary | ICD-10-CM | POA: Diagnosis not present

## 2019-06-22 DIAGNOSIS — F331 Major depressive disorder, recurrent, moderate: Secondary | ICD-10-CM | POA: Diagnosis not present

## 2019-06-29 DIAGNOSIS — F331 Major depressive disorder, recurrent, moderate: Secondary | ICD-10-CM | POA: Diagnosis not present

## 2019-07-06 DIAGNOSIS — F331 Major depressive disorder, recurrent, moderate: Secondary | ICD-10-CM | POA: Diagnosis not present

## 2019-07-13 DIAGNOSIS — F331 Major depressive disorder, recurrent, moderate: Secondary | ICD-10-CM | POA: Diagnosis not present

## 2019-07-16 DIAGNOSIS — R5382 Chronic fatigue, unspecified: Secondary | ICD-10-CM | POA: Diagnosis not present

## 2019-07-16 DIAGNOSIS — W57XXXA Bitten or stung by nonvenomous insect and other nonvenomous arthropods, initial encounter: Secondary | ICD-10-CM | POA: Diagnosis not present

## 2019-07-22 DIAGNOSIS — F331 Major depressive disorder, recurrent, moderate: Secondary | ICD-10-CM | POA: Diagnosis not present

## 2019-07-27 DIAGNOSIS — F331 Major depressive disorder, recurrent, moderate: Secondary | ICD-10-CM | POA: Diagnosis not present

## 2019-08-03 DIAGNOSIS — F331 Major depressive disorder, recurrent, moderate: Secondary | ICD-10-CM | POA: Diagnosis not present

## 2019-08-10 DIAGNOSIS — F331 Major depressive disorder, recurrent, moderate: Secondary | ICD-10-CM | POA: Diagnosis not present

## 2019-08-17 DIAGNOSIS — F331 Major depressive disorder, recurrent, moderate: Secondary | ICD-10-CM | POA: Diagnosis not present

## 2019-08-24 DIAGNOSIS — F331 Major depressive disorder, recurrent, moderate: Secondary | ICD-10-CM | POA: Diagnosis not present

## 2019-08-31 DIAGNOSIS — F331 Major depressive disorder, recurrent, moderate: Secondary | ICD-10-CM | POA: Diagnosis not present

## 2019-09-07 DIAGNOSIS — F331 Major depressive disorder, recurrent, moderate: Secondary | ICD-10-CM | POA: Diagnosis not present

## 2019-09-14 DIAGNOSIS — F331 Major depressive disorder, recurrent, moderate: Secondary | ICD-10-CM | POA: Diagnosis not present

## 2019-09-28 DIAGNOSIS — F331 Major depressive disorder, recurrent, moderate: Secondary | ICD-10-CM | POA: Diagnosis not present

## 2019-10-26 DIAGNOSIS — F331 Major depressive disorder, recurrent, moderate: Secondary | ICD-10-CM | POA: Diagnosis not present

## 2019-11-02 DIAGNOSIS — F331 Major depressive disorder, recurrent, moderate: Secondary | ICD-10-CM | POA: Diagnosis not present

## 2019-11-09 DIAGNOSIS — F339 Major depressive disorder, recurrent, unspecified: Secondary | ICD-10-CM | POA: Diagnosis not present

## 2019-11-09 DIAGNOSIS — F331 Major depressive disorder, recurrent, moderate: Secondary | ICD-10-CM | POA: Diagnosis not present

## 2019-11-09 DIAGNOSIS — Z136 Encounter for screening for cardiovascular disorders: Secondary | ICD-10-CM | POA: Diagnosis not present

## 2019-11-09 DIAGNOSIS — Z0000 Encounter for general adult medical examination without abnormal findings: Secondary | ICD-10-CM | POA: Diagnosis not present

## 2019-11-09 DIAGNOSIS — M8580 Other specified disorders of bone density and structure, unspecified site: Secondary | ICD-10-CM | POA: Diagnosis not present

## 2019-11-09 DIAGNOSIS — M8588 Other specified disorders of bone density and structure, other site: Secondary | ICD-10-CM | POA: Diagnosis not present

## 2019-11-09 DIAGNOSIS — E785 Hyperlipidemia, unspecified: Secondary | ICD-10-CM | POA: Diagnosis not present

## 2019-11-09 DIAGNOSIS — R5382 Chronic fatigue, unspecified: Secondary | ICD-10-CM | POA: Diagnosis not present

## 2019-11-09 DIAGNOSIS — Z1211 Encounter for screening for malignant neoplasm of colon: Secondary | ICD-10-CM | POA: Diagnosis not present

## 2019-11-16 DIAGNOSIS — F331 Major depressive disorder, recurrent, moderate: Secondary | ICD-10-CM | POA: Diagnosis not present

## 2019-11-17 DIAGNOSIS — Z1211 Encounter for screening for malignant neoplasm of colon: Secondary | ICD-10-CM | POA: Diagnosis not present

## 2019-11-17 DIAGNOSIS — M8580 Other specified disorders of bone density and structure, unspecified site: Secondary | ICD-10-CM | POA: Diagnosis not present

## 2019-11-17 DIAGNOSIS — Z136 Encounter for screening for cardiovascular disorders: Secondary | ICD-10-CM | POA: Diagnosis not present

## 2019-11-17 DIAGNOSIS — E785 Hyperlipidemia, unspecified: Secondary | ICD-10-CM | POA: Diagnosis not present

## 2019-11-17 DIAGNOSIS — R5382 Chronic fatigue, unspecified: Secondary | ICD-10-CM | POA: Diagnosis not present

## 2019-11-20 DIAGNOSIS — R5382 Chronic fatigue, unspecified: Secondary | ICD-10-CM | POA: Diagnosis not present

## 2019-11-20 IMAGING — MR MR PELVIS W/O CM
6 series · 42 of 48 positions shown · non-contrast
Comparison: Pelvic ultrasound dated 01/09/2017

CLINICAL DATA: Enlarging ovarian cysts

EXAM:
MRI PELVIS WITHOUT CONTRAST
TECHNIQUE: Multiplanar multisequence MR imaging of the pelvis was performed. No
intravenous contrast was administered.

[Series 2: cor haste · coronal · 6.0mm · 0.78mm/px · 7 of 30 slices shown]
[im 1/30]
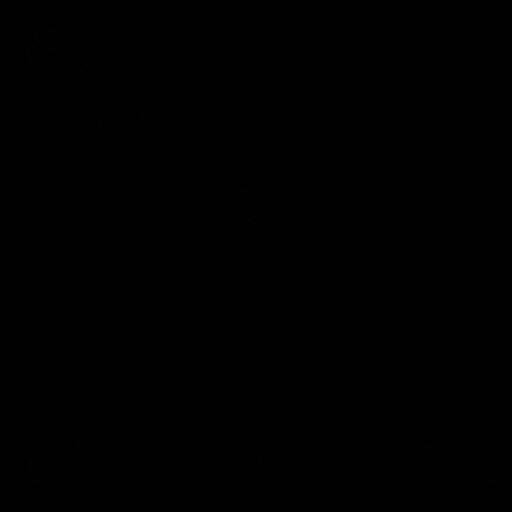
[im 5/30]
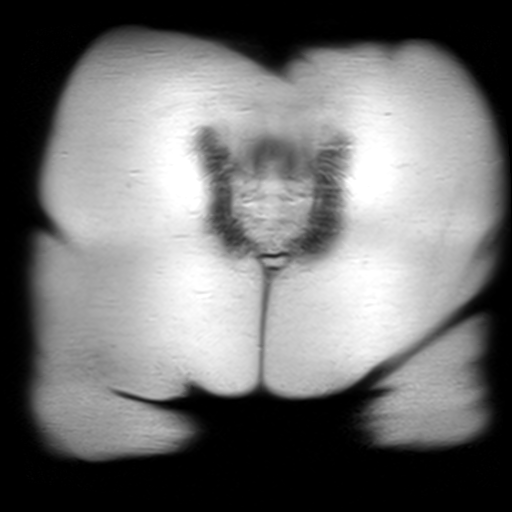
[im 10/30]
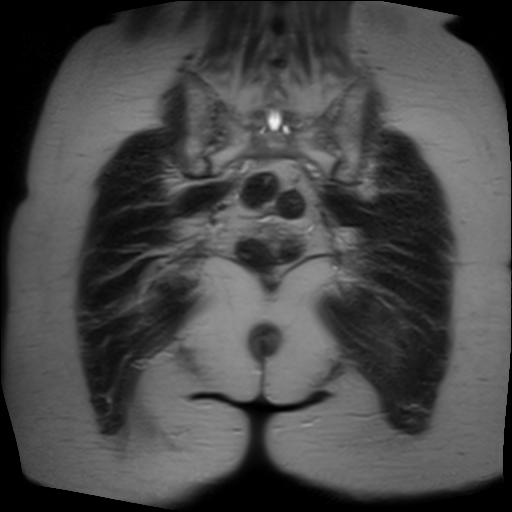
[im 15/30]
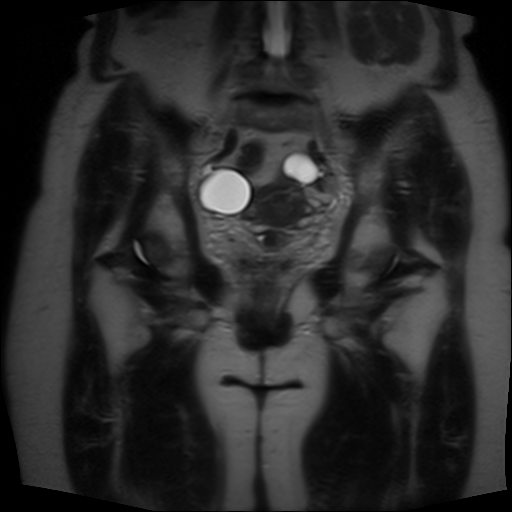
[im 20/30]
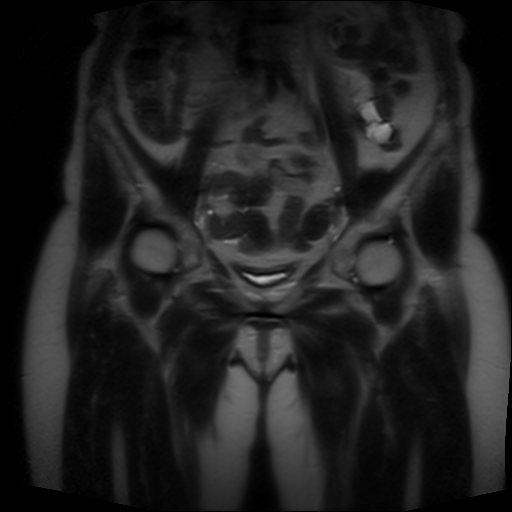
[im 25/30]
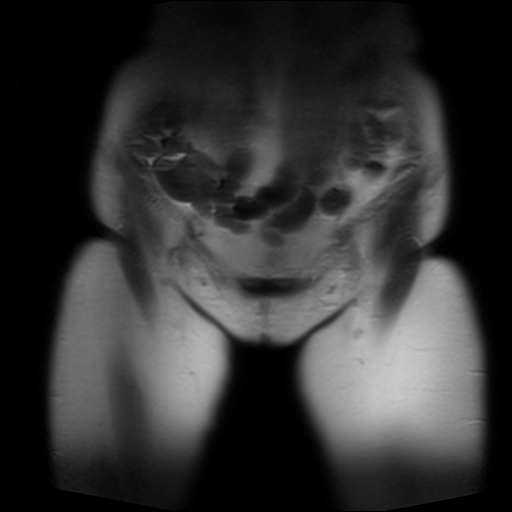
[im 30/30]
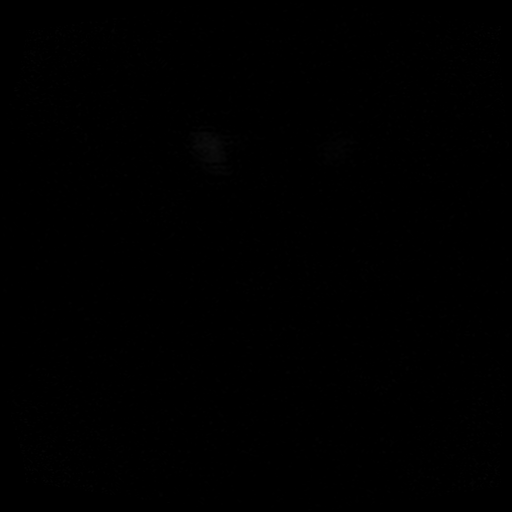

[Series 3: t2_tse_sag · sagittal · 5.0mm · 1.09mm/px · 9 of 35 slices shown]
[im 1/35]
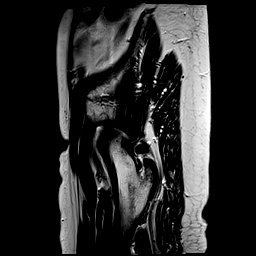
[im 5/35]
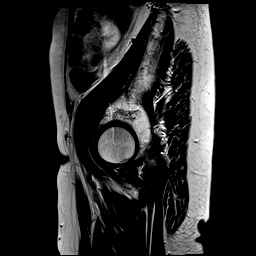
[im 9/35]
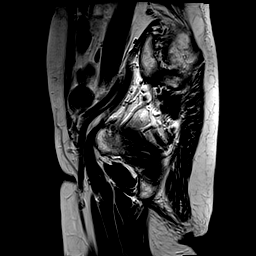
[im 13/35]
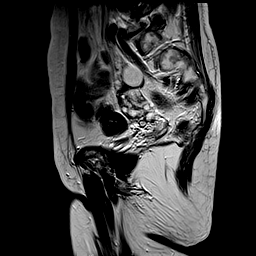
[im 18/35]
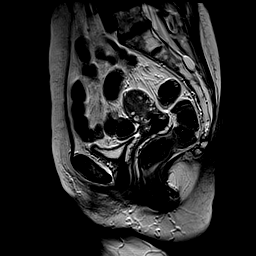
[im 22/35]
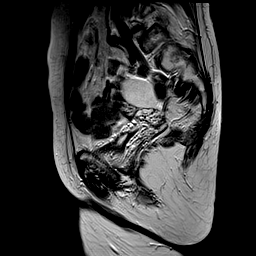
[im 26/35]
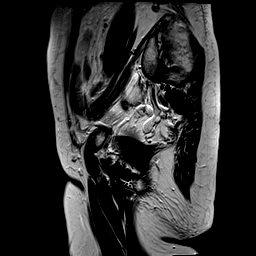
[im 30/35]
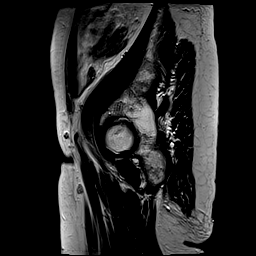
[im 35/35]
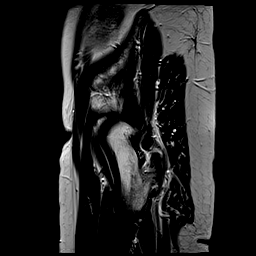

[Series 4: t2_tse axial · axial · 7.0mm · 1.02mm/px · z∈[-114,+177]mm · 8 of 33 slices shown]
[im 1/33]
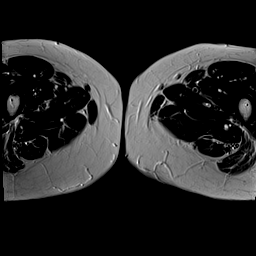
[im 5/33]
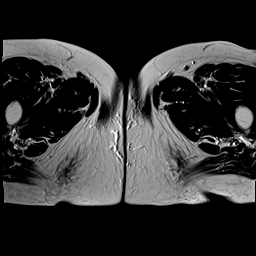
[im 10/33]
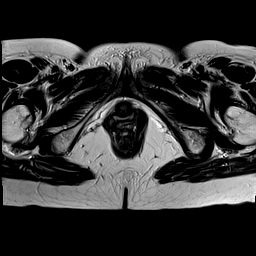
[im 14/33]
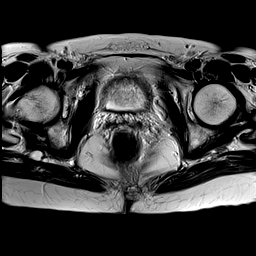
[im 19/33]
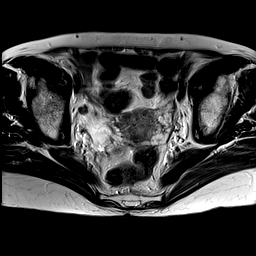
[im 23/33]
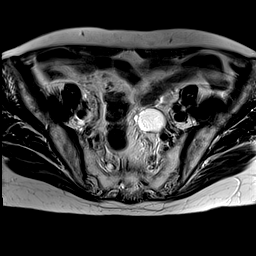
[im 28/33]
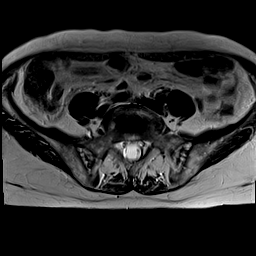
[im 33/33]
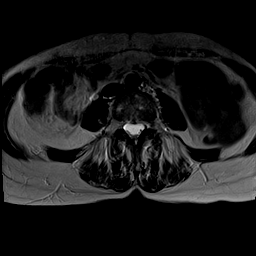

[Series 5: t2_tse axial fs · axial · 7.0mm · 1.02mm/px · z∈[-114,+177]mm · 8 of 33 slices shown]
[im 1/33]
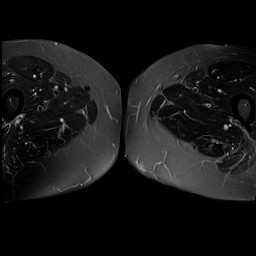
[im 5/33]
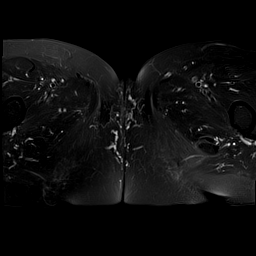
[im 10/33]
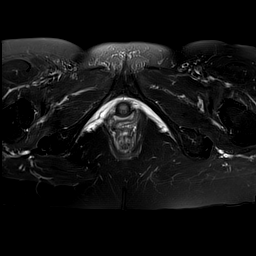
[im 14/33]
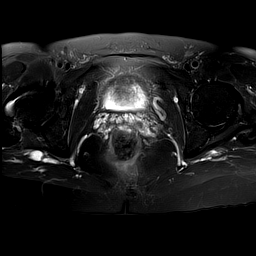
[im 19/33]
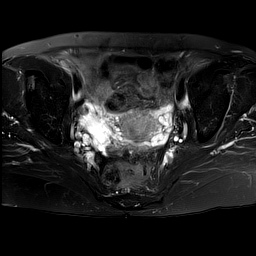
[im 23/33]
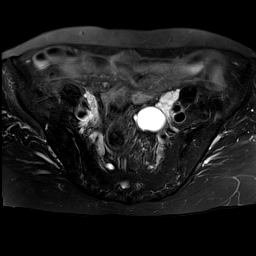
[im 28/33]
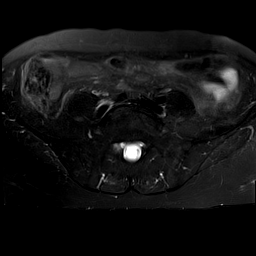
[im 33/33]
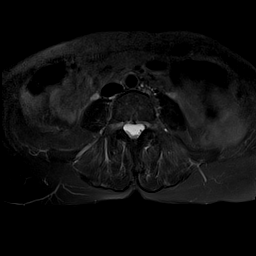

[Series 6: axial spgr · axial · 7.0mm · 1.02mm/px · z∈[-114,+177]mm · 8 of 33 slices shown]
[im 1/33]
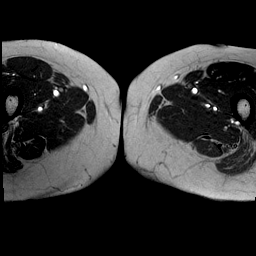
[im 5/33]
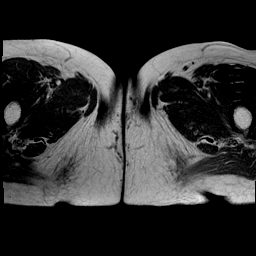
[im 10/33]
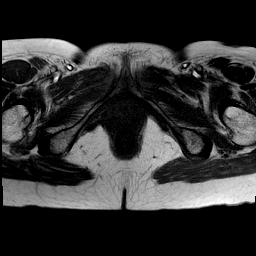
[im 14/33]
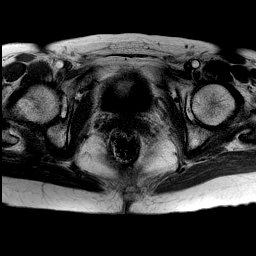
[im 19/33]
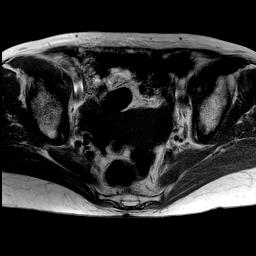
[im 23/33]
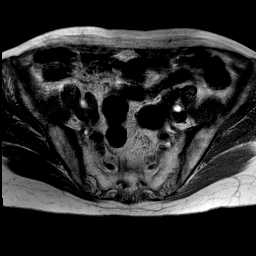
[im 28/33]
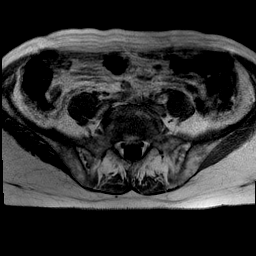
[im 33/33]
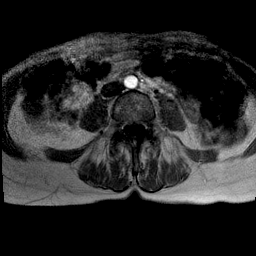

[Series 7: axial spgr pre · axial · non-contrast · 7.0mm · 0.51mm/px · z∈[-114,-77]mm · 2 of 33 slices shown]
[im 1/33]
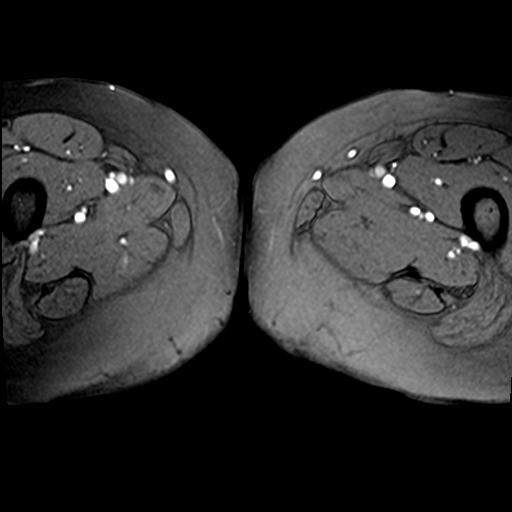
[im 5/33]
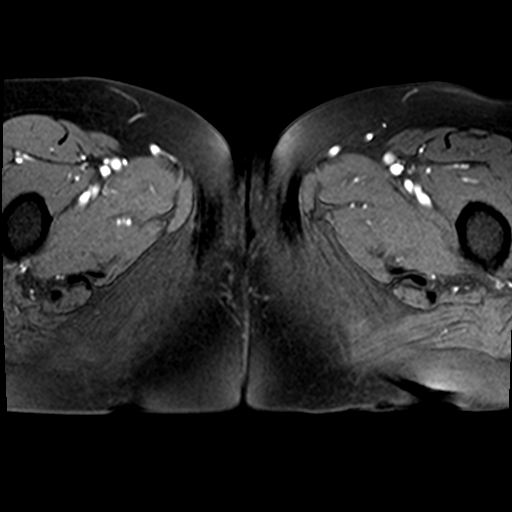

[42 of 48 positions shown; findings below may reference images not displayed]

FINDINGS: Note: Patient refused intravenous contrast administration.

Urinary Tract:  Bladder is underdistended but unremarkable.

Bowel:  Visualized bowel is unremarkable.

Vascular/Lymphatic: No evidence of aneurysm.

No suspicious pelvic lymphadenopathy.

Reproductive: Uterus is within normal limits. Endometrium measures 3
mm.

4.7 x 3.4 cm right ovarian cystic lesion (series 4/image 14),
without definite mural nodularity/solid component.

2.4 x 3.3 cm left ovarian cystic lesion (series 4/image 11), with
thin septation medially, but without definite mural nodularity/solid
component.

Both lesions are incompletely evaluated in the absence of
intravenous contrast administration. However, there are no overt
malignant features.

Other:  No pelvic ascites.

Musculoskeletal: No suspicious osseous lesions. Heterogeneous marrow
signal, unchanged from prior lumbar spine MRI, benign.
IMPRESSION: Bilateral ovarian cystic lesions, measuring 4.7 cm on the right and
3.3 cm on the left. While incompletely evaluated in the absence of
intravenous contrast administration, there are no overt malignant
features.

Consider annual follow-up pelvic ultrasound as clinically warranted.

## 2019-11-23 DIAGNOSIS — F331 Major depressive disorder, recurrent, moderate: Secondary | ICD-10-CM | POA: Diagnosis not present

## 2019-11-30 DIAGNOSIS — F331 Major depressive disorder, recurrent, moderate: Secondary | ICD-10-CM | POA: Diagnosis not present

## 2019-12-07 DIAGNOSIS — F331 Major depressive disorder, recurrent, moderate: Secondary | ICD-10-CM | POA: Diagnosis not present

## 2019-12-14 DIAGNOSIS — F331 Major depressive disorder, recurrent, moderate: Secondary | ICD-10-CM | POA: Diagnosis not present

## 2019-12-21 DIAGNOSIS — F331 Major depressive disorder, recurrent, moderate: Secondary | ICD-10-CM | POA: Diagnosis not present

## 2019-12-29 DIAGNOSIS — F331 Major depressive disorder, recurrent, moderate: Secondary | ICD-10-CM | POA: Diagnosis not present

## 2020-01-04 DIAGNOSIS — F331 Major depressive disorder, recurrent, moderate: Secondary | ICD-10-CM | POA: Diagnosis not present

## 2020-01-11 DIAGNOSIS — F331 Major depressive disorder, recurrent, moderate: Secondary | ICD-10-CM | POA: Diagnosis not present

## 2020-01-18 DIAGNOSIS — F331 Major depressive disorder, recurrent, moderate: Secondary | ICD-10-CM | POA: Diagnosis not present

## 2020-01-25 DIAGNOSIS — F331 Major depressive disorder, recurrent, moderate: Secondary | ICD-10-CM | POA: Diagnosis not present

## 2020-02-01 DIAGNOSIS — F331 Major depressive disorder, recurrent, moderate: Secondary | ICD-10-CM | POA: Diagnosis not present

## 2020-02-05 DIAGNOSIS — Z20822 Contact with and (suspected) exposure to covid-19: Secondary | ICD-10-CM | POA: Diagnosis not present

## 2020-02-08 DIAGNOSIS — F331 Major depressive disorder, recurrent, moderate: Secondary | ICD-10-CM | POA: Diagnosis not present

## 2020-02-11 DIAGNOSIS — Z20822 Contact with and (suspected) exposure to covid-19: Secondary | ICD-10-CM | POA: Diagnosis not present

## 2020-02-16 DIAGNOSIS — F331 Major depressive disorder, recurrent, moderate: Secondary | ICD-10-CM | POA: Diagnosis not present

## 2020-02-22 DIAGNOSIS — F331 Major depressive disorder, recurrent, moderate: Secondary | ICD-10-CM | POA: Diagnosis not present

## 2020-02-29 DIAGNOSIS — F331 Major depressive disorder, recurrent, moderate: Secondary | ICD-10-CM | POA: Diagnosis not present

## 2020-03-07 DIAGNOSIS — F331 Major depressive disorder, recurrent, moderate: Secondary | ICD-10-CM | POA: Diagnosis not present

## 2020-03-14 DIAGNOSIS — F331 Major depressive disorder, recurrent, moderate: Secondary | ICD-10-CM | POA: Diagnosis not present

## 2020-03-21 DIAGNOSIS — F331 Major depressive disorder, recurrent, moderate: Secondary | ICD-10-CM | POA: Diagnosis not present

## 2020-03-28 DIAGNOSIS — F331 Major depressive disorder, recurrent, moderate: Secondary | ICD-10-CM | POA: Diagnosis not present

## 2020-04-04 DIAGNOSIS — F331 Major depressive disorder, recurrent, moderate: Secondary | ICD-10-CM | POA: Diagnosis not present

## 2020-04-11 DIAGNOSIS — F331 Major depressive disorder, recurrent, moderate: Secondary | ICD-10-CM | POA: Diagnosis not present

## 2020-04-18 DIAGNOSIS — F331 Major depressive disorder, recurrent, moderate: Secondary | ICD-10-CM | POA: Diagnosis not present

## 2022-01-09 ENCOUNTER — Other Ambulatory Visit: Payer: Self-pay | Admitting: Obstetrics & Gynecology

## 2022-01-09 DIAGNOSIS — R21 Rash and other nonspecific skin eruption: Secondary | ICD-10-CM

## 2022-01-09 DIAGNOSIS — N644 Mastodynia: Secondary | ICD-10-CM

## 2022-01-18 ENCOUNTER — Other Ambulatory Visit: Payer: Medicare Other

## 2022-02-28 ENCOUNTER — Ambulatory Visit: Admission: RE | Admit: 2022-02-28 | Payer: Medicare Other | Source: Ambulatory Visit

## 2022-02-28 ENCOUNTER — Ambulatory Visit
Admission: RE | Admit: 2022-02-28 | Discharge: 2022-02-28 | Disposition: A | Discharge status: Home / Self Care | Payer: Medicare HMO | Source: Ambulatory Visit | Attending: Obstetrics & Gynecology | Admitting: Obstetrics & Gynecology

## 2022-02-28 DIAGNOSIS — N644 Mastodynia: Secondary | ICD-10-CM

## 2022-02-28 DIAGNOSIS — R21 Rash and other nonspecific skin eruption: Secondary | ICD-10-CM

## 2023-02-01 ENCOUNTER — Ambulatory Visit (HOSPITAL_BASED_OUTPATIENT_CLINIC_OR_DEPARTMENT_OTHER): Payer: Medicare HMO | Admitting: Cardiology

## 2023-02-01 ENCOUNTER — Encounter (HOSPITAL_BASED_OUTPATIENT_CLINIC_OR_DEPARTMENT_OTHER): Payer: Self-pay | Admitting: Cardiology

## 2023-02-01 VITALS — BP 124/70 | HR 63 | Ht 64.0 in | Wt 155.4 lb

## 2023-02-01 DIAGNOSIS — Z8249 Family history of ischemic heart disease and other diseases of the circulatory system: Secondary | ICD-10-CM

## 2023-02-01 DIAGNOSIS — R002 Palpitations: Secondary | ICD-10-CM | POA: Diagnosis not present

## 2023-02-01 DIAGNOSIS — Z7189 Other specified counseling: Secondary | ICD-10-CM

## 2023-02-01 NOTE — Patient Instructions (Signed)

## 2023-02-01 NOTE — Progress Notes (Signed)
Cardiology Office Note:  .    Date:  02/01/2023  ID:  TAJE LILL, DOB [birth year], MRN [id number] PCP: Shirlean Mylar, MD  [facility] Providers Cardiologist:  Jodelle Red, MD     History of Present Illness: .    Sherry Thompson is a 77 y.o. female with a hx of palpitations, anxiety, here for the evaluation of palpitations.  Years ago, she had an episode of palpitations and presented to the ER with normal work-up. More recently in December, she had seen her PCP and was told that her enzymes showed early heart failure, which concerned her. Repeat echo was reportedly notable for valvular regurgitation.   In the past year she has been under significant stress and anxiety in the setting of unwanted relationship stressors, which had significantly exacerbated her palpitations. Has woken up at night with severe rapid heart beats. Lately her fast heart beats occur every once in a while, often triggered by stress or anxiety including traffic.  Cardiovascular risk factors: Metabolic syndrome/Obesity:  Current weight 155 lbs.  Tobacco use history: Never. Family history: Her mother died of CHF, had heart disease, Afib, hypertension, hyperlipidemia, COPD. 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 Prior pertinent testing and/or incidental findings: Seen at [facility]. EKG showed premature beat, no afib. Wore a heart monitor for 7 days. Exercise level: For activity, she is walking her dog twice a day for 30-40 minutes each time at a generally slower pace. Has been more difficult with decreased leg strength. Harder to go up and down stairs. She reports one isolated episode of chest pain when lying down in the middle of the night, believes it may have been indigestion or gas. This did not recur.  Current diet: Complains of some weight gain which she attributes to drinking a lot of wine, up to 1 bottle throughout the day. Generally healthy diet, no processed foods.    She denies any peripheral edema, lightheadedness, headaches, syncope, orthopnea, or PND.  ROS:  Please see the history of present illness. ROS otherwise negative except as noted.  (+) Palpitations (+) Stress/Anxiety  Studies Reviewed: Marland Kitchen    EKG Interpretation Date/Time:  Friday February 01 2023 16:26:45 EDT Ventricular Rate:  63 PR Interval:  184 QRS Duration:  90 QT Interval:  382 QTC Calculation: 390 R Axis:   37  Text Interpretation: Normal sinus rhythm Normal ECG Confirmed by Jodelle Red [phone]) on 02/01/2023 4:28:53 PM    Physical Exam:    VS:  BP 124/70 (BP Location: Left Arm, Patient Position: Sitting, Cuff Size: Normal)   Pulse 63   Ht 5\' 4"  (1.626 m)   Wt 155 lb 6.4 oz (70.5 kg)   SpO2 97%   BMI 26.67 kg/m    Wt Readings from Last 3 Encounters:  02/01/23 155 lb 6.4 oz (70.5 kg)  01/28/16 135 lb (61.2 kg)    GEN: Well nourished, well developed in no acute distress HEENT: Normal, moist mucous membranes NECK: No JVD CARDIAC: regular rhythm, normal S1 and S2, no rubs or gallops. No murmur appreciated today. VASCULAR: Radial and DP pulses 2+ bilaterally. No carotid bruits RESPIRATORY:  Clear to auscultation without rales, wheezing or rhonchi  ABDOMEN: Soft, non-tender, non-distended MUSCULOSKELETAL:  Ambulates independently SKIN: Warm and dry, no edema NEUROLOGIC:  Alert and oriented x 3. No focal neuro deficits noted. PSYCHIATRIC:  Normal affect   ASSESSMENT AND PLAN: .    Palpitations, history of PVCs Elevated cholesterol (per report)  Family history of heart disease She forgot records today, will bring to our office soon.  -She declines addition of statin therapy now or in the future  UPDATED TO ADD: Medical records from [facility] center brought by patient, reviewed as below and will be scanned into the EMR. She also reports records were requested from [facility] (including echo, BNP). These have not yet been received.  Note from  11/03/22, Dr. Mercy Riding: Seen for palpitations and shortness of breath. Ordered for monitor, echo, and stress test. Monitor report reviewed, noted ST with 11% burden of PVCs and 2.8% burden of PACs. No afib, VT, SVT, or pauses noted.   See mychart message, I offered metoprolol if she is symptompatic. Will attempt to get the rest of the records.  CV risk counseling and prevention -recommend heart healthy/Mediterranean diet, with whole grains, fruits, vegetable, fish, lean meats, nuts, and olive oil. Limit salt. -recommend moderate walking, 3-5 times/week for 30-50 minutes each session. Aim for at least 150 minutes.week. Goal should be pace of 3 miles/hours, or walking 1.5 miles in 30 minutes -recommend avoidance of tobacco products. Avoid excess alcohol.  Dispo: Follow-up in 1 year, or sooner as needed.  I,Mathew Stumpf,acting as a Neurosurgeon for [employer], MD.,have documented all relevant documentation on the behalf of Jodelle Red, MD,as directed by  Jodelle Red, MD while in the presence of Jodelle Red, MD.  I, Jodelle Red, MD, have reviewed all documentation for this visit. The documentation on 03/24/23 for the exam, diagnosis, procedures, and orders are all accurate and complete.   Signed, Jodelle Red, MD

## 2023-02-12 ENCOUNTER — Telehealth (HOSPITAL_BASED_OUTPATIENT_CLINIC_OR_DEPARTMENT_OTHER): Payer: Self-pay | Admitting: Cardiology

## 2023-02-12 NOTE — Telephone Encounter (Signed)
Patient brought in her medical records for Sherry Thompson to review. Will be in Sherry Thompson box

## 2023-03-24 ENCOUNTER — Encounter (HOSPITAL_BASED_OUTPATIENT_CLINIC_OR_DEPARTMENT_OTHER): Payer: Self-pay

## 2023-03-25 ENCOUNTER — Encounter (HOSPITAL_BASED_OUTPATIENT_CLINIC_OR_DEPARTMENT_OTHER): Payer: Self-pay

## 2023-05-15 ENCOUNTER — Encounter (HOSPITAL_BASED_OUTPATIENT_CLINIC_OR_DEPARTMENT_OTHER): Payer: Self-pay

## 2024-03-04 ENCOUNTER — Ambulatory Visit (HOSPITAL_BASED_OUTPATIENT_CLINIC_OR_DEPARTMENT_OTHER): Payer: Medicare (Managed Care) | Admitting: Cardiology

## 2024-03-04 ENCOUNTER — Encounter (HOSPITAL_BASED_OUTPATIENT_CLINIC_OR_DEPARTMENT_OTHER): Payer: Self-pay | Admitting: Cardiology

## 2024-03-04 VITALS — BP 124/82 | HR 54 | Ht 64.0 in | Wt 150.7 lb

## 2024-03-04 DIAGNOSIS — I493 Ventricular premature depolarization: Secondary | ICD-10-CM

## 2024-03-04 DIAGNOSIS — R002 Palpitations: Secondary | ICD-10-CM

## 2024-03-04 DIAGNOSIS — Z7189 Other specified counseling: Secondary | ICD-10-CM | POA: Diagnosis not present

## 2024-03-04 DIAGNOSIS — Z8249 Family history of ischemic heart disease and other diseases of the circulatory system: Secondary | ICD-10-CM

## 2024-03-04 NOTE — Patient Instructions (Signed)
 Medication Instructions:  No changes *If you need a refill on your cardiac medications before your next appointment, please call your pharmacy*  Lab Work: none If you have labs (blood work) drawn today and your tests are completely normal, you will receive your results only by: MyChart Message (if you have MyChart) OR A paper copy in the mail If you have any lab test that is abnormal or we need to change your treatment, we will call you to review the results.  Testing/Procedures: none  Follow-Up: At [facility], you and your health needs are our priority.  As part of our continuing mission to provide you with exceptional heart care, our providers are all part of one team.  This team includes your primary Cardiologist (physician) and Advanced Practice Providers or APPs (Physician Assistants and Nurse Practitioners) who all work together to provide you with the care you need, when you need it.  Your next appointment:   12 month(s)  Provider:   Shelda Bruckner, MD, Rosaline Bane, NP, or Reche Finder, NP

## 2024-03-04 NOTE — Progress Notes (Signed)
 Cardiology Office Note:  .    Date:  03/04/2024  ID:  Sherry Thompson, DOB February 12, 1946, MRN [id number] PCP: Douglass Ivanoff, MD  [facility] Providers Cardiologist:  Shelda Bruckner, MD     History of Present Illness: .    Sherry Thompson is a 78 y.o. female with a hx of palpitations, anxiety, here for follow up. I met her 02/01/23 for evaluation of palpitations.  CV history:  Note from 11/03/22, Dr. Jeronimo: Seen for palpitations and shortness of breath. Ordered for monitor, echo, and stress test. Monitor report reviewed, noted ST with 11% burden of PVCs and 2.8% burden of PACs. No afib, VT, SVT, or pauses noted.   Echo results available under media tab on 04/11/2023, largely unremarkable. Mild to mild/moderate MR based on report.  Family history: Her mother died of CHF, had heart disease, Afib, hypertension, hyperlipidemia, COPD. 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  Today: Has been working to lose weight, has cut out wine and eating a lot of vegetables. Not sure how much she has lost on her home scale but has noticed a difference in the last 4 weeks.  Doesn't like driving, feels stressed, thinks this is why her initial blood pressure was up. Was feeling a lot of palpitations during ECG, reviewed. She feels that this was because she was stressed.   Her palpitations improved when her stressful relationship ended.   Prefers to avoid medications if possible. Feels that palpitations are generally well managed and nonlimiting. We discussed options for management today.  Walks about 2 miles/day.  ROS:  Denies chest pain, shortness of breath at rest or with normal exertion. No PND, orthopnea, LE edema or unexpected weight gain. No syncope. ROS otherwise negative except as noted.   Studies Reviewed: SABRA    EKG Interpretation Date/Time:  Wednesday March 04 2024 13:30:16 EST Ventricular Rate:  103 PR Interval:  164 QRS Duration:  90 QT Interval:  356 QTC  Calculation: 466 R Axis:   54  Text Interpretation: Sinus rhythm with frequent Premature ventricular complexes Confirmed by Bruckner Shelda [id number]) on 03/04/2024 1:41:44 PM    Physical Exam:    VS:  BP 124/82 (Cuff Size: Normal)   Pulse (!) 54   Ht 5' 4 (1.626 m)   Wt 150 lb 11.2 oz (68.4 kg)   SpO2 99%   BMI 25.87 kg/m    Wt Readings from Last 3 Encounters:  03/04/24 150 lb 11.2 oz (68.4 kg)  02/01/23 155 lb 6.4 oz (70.5 kg)  01/28/16 135 lb (61.2 kg)    GEN: Well nourished, well developed in no acute distress HEENT: Normal, moist mucous membranes NECK: No JVD CARDIAC: regular rhythm, normal S1 and S2, no rubs or gallops. No murmur appreciated today. VASCULAR: Radial and DP pulses 2+ bilaterally. No carotid bruits RESPIRATORY:  Clear to auscultation without rales, wheezing or rhonchi  ABDOMEN: Soft, non-tender, non-distended MUSCULOSKELETAL:  Ambulates independently SKIN: Warm and dry, no edema NEUROLOGIC:  Alert and oriented x 3. No focal neuro deficits noted. PSYCHIATRIC:  Normal affect   ASSESSMENT AND PLAN: .    Palpitations, history of PVCs Family history of heart disease -has frequent PVCs on ECG today, was very nervous at the time. On exam later, ectopy was rare -we discussed options. She would like to avoid medication unless absolutely necessary. We discussed beta blockers and calcium channels blockers -prior echo was normal  Elevated cholesterol history -labs not available -has declined statins  in the past -no known ASCVD -given age, patient preference, no indication for medication at this time unless she were to be diagnosed with ASCVD  CV risk counseling and prevention -recommend heart healthy/Mediterranean diet, with whole grains, fruits, vegetable, fish, lean meats, nuts, and olive oil. Limit salt. -recommend moderate walking, 3-5 times/week for 30-50 minutes each session. Aim for at least 150 minutes/week. Goal should be pace of 3 miles/hours, or  walking 1.5 miles in 30 minutes -recommend avoidance of tobacco products. Avoid excess alcohol.  Dispo: Follow-up in 1 year, or sooner as needed.  Signed, Shelda Bruckner, MD

## 2024-04-21 ENCOUNTER — Telehealth: Payer: Self-pay | Admitting: Cardiology

## 2024-04-21 NOTE — Telephone Encounter (Signed)
 Patient c/o Palpitations: STAT if patient c/o lightheadedness, shortness of breath, or chest pain  How long have you had palpitations/irregular HR/ Afib? Are you having the symptoms now? Started 3-4 days ago  Are you currently experiencing lightheadedness, SOB or CP? Not currently, but feels lightheaded when she moves around  Do you have a history of afib (atrial fibrillation) or irregular heart rhythm? Yes  Have you checked your BP or HR? (document readings if available): HR 74 oxygen 98 while on the phone   Are you experiencing any other symptoms? Heart thumping around, pulse does not feel like a regular rhythm, and has a headache.  Patient stated she has noticed that she is having PVCs more frequently. Patient thought it could be from the holiday stress. Patient she does not feel right and was looking to come in and have an EKG completed. Please advise.

## 2024-04-21 NOTE — Telephone Encounter (Signed)
 Spoke with patient regarding HR and palpitations Per patient feels different then what she has had before HR has been elevated at times today but not above 120  Scheduled patient an appointment for tomorrow with Reche ORN NP Advised to go to ED if worsening symptoms or elevated HR that doesn't come down

## 2024-04-22 ENCOUNTER — Encounter (HOSPITAL_BASED_OUTPATIENT_CLINIC_OR_DEPARTMENT_OTHER): Payer: Self-pay | Admitting: Family

## 2024-04-22 ENCOUNTER — Ambulatory Visit (HOSPITAL_BASED_OUTPATIENT_CLINIC_OR_DEPARTMENT_OTHER): Payer: Medicare (Managed Care) | Admitting: Family

## 2024-04-22 ENCOUNTER — Ambulatory Visit: Payer: Medicare (Managed Care) | Attending: Family

## 2024-04-22 VITALS — BP 124/82 | HR 77 | Ht 64.0 in | Wt 152.8 lb

## 2024-04-22 DIAGNOSIS — R002 Palpitations: Secondary | ICD-10-CM

## 2024-04-22 DIAGNOSIS — I471 Supraventricular tachycardia, unspecified: Secondary | ICD-10-CM

## 2024-04-22 DIAGNOSIS — E559 Vitamin D deficiency, unspecified: Secondary | ICD-10-CM

## 2024-04-22 NOTE — Progress Notes (Unsigned)
" °  Cardiology Office Note   Date:  04/22/2024  ID:  Sherry Thompson, Sherry Thompson [birth year], MRN [id number] PCP: Douglass Ivanoff, MD  [facility] Providers Cardiologist:  Shelda Bruckner, MD { Click to update primary MD,subspecialty MD or APP then REFRESH:1}    History of Present Illness Sherry Thompson is a 78 y.o. female with history of palpitations, hyperlipidemia, PVC, SVT.   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   Onset of progressive palpitations starting 4 nights ago. Occurs at different times throughout the day. Reports present palpitations are bigger and scarier and associated with lightheadedness, shortness of breath. These are causing understandable anxiety. They are lasting prolonged period of time. She avoid caffeine other than a bit of dark chocolate. She drinks wine most nights but this is not increased from her baseline. She started therapy in September preemptively with stress regarding holidays which she did feel prepared her well. However, does note some stressors as ***. Multiple changes with holisday plans related to illness ***. She does not she does not always hydrate well but has been working to trw automotive. Has not checked blood pressure at home but does have a cuff. She checked her oxygen level at home which was unremarkable. Her heart rate with pulse oximeter has been 80-90s. Notes in midst of psoriasis.   Notes mother died with atrial fibrillation which makes her apprehensive regarding arrhythmia.   Magnesium at bedtime for sleep  she  No cough, fever, wheezing.   BB or CCB? Declines statin F/u 1 year or PRN    ROS: Please see the history of present illness.    All other systems reviewed and are negative.   Studies Reviewed      *** Risk Assessment/Calculations   The patient's 1st BP is elevated (>139/89)*** Repeat BP and {Click to enter a 2nd BP Refresh Note  :1}       Physical Exam VS:  BP (!) 142/82 (BP Location: Left  Arm)   Pulse 77   Ht 5' 4 (1.626 m)   Wt 152 lb 12.8 oz (69.3 kg)   SpO2 98%   BMI 26.23 kg/m        Wt Readings from Last 3 Encounters:  04/22/24 152 lb 12.8 oz (69.3 kg)  03/04/24 150 lb 11.2 oz (68.4 kg)  02/01/23 155 lb 6.4 oz (70.5 kg)    GEN: Well nourished, well developed in no acute distress NECK: No JVD; No carotid bruits CARDIAC: ***RRR, no murmurs, rubs, gallops RESPIRATORY:  Clear to auscultation without rales, wheezing or rhonchi  ABDOMEN: Soft, non-tender, non-distended EXTREMITIES:  No edema; No deformity   ASSESSMENT AND PLAN  Palpitations - *** Labs today: BMP, CBC, TSH, mag Prefers to avoid medications, politely declines both PRN and daily dosing of BB or CCB If monitor with arrhythmia, likely refer to EP  Elevated blood pressure without diagnosis of hypertension -        Dispo: follow up in ***  Signed, Reche GORMAN Finder, NP   "

## 2024-04-22 NOTE — Progress Notes (Unsigned)
 Enrolled for [employer] to mail a ZIO XT long term holter monitor to the patients address on file.   Dr. Jodelle Red to read.

## 2024-04-22 NOTE — Patient Instructions (Addendum)
 Medication Instructions:  Continue your current medications.   If you would like a prescription for as-needed Metoprolol or Propranolol call or send us  a MyChart message to let us  know.    Labwork: Your physician recommends that you return for lab work today: BMP, magnesium, TSH, CBC   Testing/Procedures: Your EKG today shows normal sinus rhythm with one early beat in the top chamber of your heart and one in the bottom of your heart  Your physician has recommended that you wear a Zio monitor.   This monitor is a medical device that records the hearts electrical activity. Doctors most often use these monitors to diagnose arrhythmias. Arrhythmias are problems with the speed or rhythm of the heartbeat. The monitor is a small device applied to your chest. You can wear one while you do your normal daily activities. While wearing this monitor if you have any symptoms to push the button and record what you felt. Once you have worn this monitor for the period of time provider prescribed (Usually 14 days - if you have symptoms after 7 days you can remove), you will return the monitor device in the postage paid box. Once it is returned they will download the data collected and provide us  with a report which the provider will then review and we will call you with those results. Important tips:  Avoid showering during the first 24 hours of wearing the monitor. Avoid excessive sweating to help maximize wear time. Do not submerge the device, no hot tubs, and no swimming pools. Keep any lotions or oils away from the patch. After 24 hours you may shower with the patch on. Take brief showers with your back facing the shower head.  Do not remove patch once it has been placed because that will interrupt data and decrease adhesive wear time. Push the button when you have any symptoms and write down what you were feeling. Once you have completed wearing your monitor, remove and place into box which has postage  paid and place in your outgoing mailbox.  If for some reason you have misplaced your box then call our office and we can provide another box and/or mail it off for you.  Follow-Up: Please follow up in 2 months with Dr. Lonni or Advanced Practice Provider   Special Instructions:    To prevent palpitations: Make sure you are adequately hydrated.  Avoid and/or limit caffeine containing beverages like soda or tea. Exercise regularly.  Manage stress well. Some over the counter medications can cause palpitations such as Benadryl, AdvilPM, TylenolPM. Regular Advil or Tylenol  do not cause palpitations.

## 2024-04-23 LAB — BASIC METABOLIC PANEL WITH GFR
BUN/Creatinine Ratio: 15 (ref 12–28)
BUN: 13 mg/dL (ref 8–27)
CO2: 23 mmol/L (ref 20–29)
Calcium: 9.9 mg/dL (ref 8.7–10.3)
Chloride: 101 mmol/L (ref 96–106)
Creatinine, Ser: 0.89 mg/dL (ref 0.57–1.00)
Glucose: 88 mg/dL (ref 70–99)
Potassium: 4.6 mmol/L (ref 3.5–5.2)
Sodium: 138 mmol/L (ref 134–144)
eGFR: 66 mL/min/1.73

## 2024-04-23 LAB — CBC
Hematocrit: 43.5 % (ref 34.0–46.6)
Hemoglobin: 14.3 g/dL (ref 11.1–15.9)
MCH: 32.7 pg (ref 26.6–33.0)
MCHC: 32.9 g/dL (ref 31.5–35.7)
MCV: 100 fL — ABNORMAL HIGH (ref 79–97)
Platelets: 221 x10E3/uL (ref 150–450)
RBC: 4.37 x10E6/uL (ref 3.77–5.28)
RDW: 13.3 % (ref 11.7–15.4)
WBC: 6.3 x10E3/uL (ref 3.4–10.8)

## 2024-04-23 LAB — TSH: TSH: 1.24 u[IU]/mL (ref 0.450–4.500)

## 2024-04-23 LAB — VITAMIN D 25 HYDROXY (VIT D DEFICIENCY, FRACTURES): Vit D, 25-Hydroxy: 31.1 ng/mL (ref 30.0–100.0)

## 2024-04-23 LAB — MAGNESIUM: Magnesium: 2.3 mg/dL (ref 1.6–2.3)

## 2024-04-24 ENCOUNTER — Ambulatory Visit (HOSPITAL_BASED_OUTPATIENT_CLINIC_OR_DEPARTMENT_OTHER): Payer: Self-pay | Admitting: Family

## 2024-07-15 ENCOUNTER — Ambulatory Visit (HOSPITAL_BASED_OUTPATIENT_CLINIC_OR_DEPARTMENT_OTHER): Payer: Medicare (Managed Care) | Admitting: Cardiology
